# Patient Record
Sex: Female | Born: 1945 | Race: White | Hispanic: No | Marital: Married | Smoking: Never smoker
Health system: Southern US, Community
[De-identification: ages and names within clinical notes are randomized; demographics above are authoritative.]

## PROBLEM LIST (undated history)

## (undated) DIAGNOSIS — M1990 Unspecified osteoarthritis, unspecified site: Secondary | ICD-10-CM

## (undated) DIAGNOSIS — F32A Depression, unspecified: Secondary | ICD-10-CM

## (undated) DIAGNOSIS — Z7901 Long term (current) use of anticoagulants: Secondary | ICD-10-CM

## (undated) DIAGNOSIS — I4891 Unspecified atrial fibrillation: Secondary | ICD-10-CM

## (undated) DIAGNOSIS — L301 Dyshidrosis [pompholyx]: Secondary | ICD-10-CM

## (undated) DIAGNOSIS — G479 Sleep disorder, unspecified: Secondary | ICD-10-CM

## (undated) DIAGNOSIS — H269 Unspecified cataract: Secondary | ICD-10-CM

## (undated) DIAGNOSIS — F329 Major depressive disorder, single episode, unspecified: Secondary | ICD-10-CM

## (undated) DIAGNOSIS — I499 Cardiac arrhythmia, unspecified: Secondary | ICD-10-CM

## (undated) DIAGNOSIS — E042 Nontoxic multinodular goiter: Secondary | ICD-10-CM

## (undated) DIAGNOSIS — I471 Supraventricular tachycardia, unspecified: Secondary | ICD-10-CM

## (undated) DIAGNOSIS — E039 Hypothyroidism, unspecified: Secondary | ICD-10-CM

## (undated) DIAGNOSIS — E785 Hyperlipidemia, unspecified: Secondary | ICD-10-CM

## (undated) DIAGNOSIS — J449 Chronic obstructive pulmonary disease, unspecified: Secondary | ICD-10-CM

## (undated) HISTORY — PX: THYROIDECTOMY, PARTIAL: SHX18

---

## 2004-07-29 ENCOUNTER — Ambulatory Visit: Payer: Self-pay | Admitting: Gastroenterology

## 2008-07-20 ENCOUNTER — Ambulatory Visit: Payer: Self-pay | Admitting: Internal Medicine

## 2009-07-23 ENCOUNTER — Ambulatory Visit: Payer: Self-pay | Admitting: Internal Medicine

## 2010-03-01 ENCOUNTER — Ambulatory Visit: Payer: Self-pay | Admitting: Internal Medicine

## 2010-12-25 ENCOUNTER — Ambulatory Visit: Payer: Self-pay | Admitting: Internal Medicine

## 2012-06-21 ENCOUNTER — Ambulatory Visit: Payer: Self-pay | Admitting: Family Medicine

## 2012-10-13 HISTORY — PX: EYE SURGERY: SHX253

## 2013-09-01 DIAGNOSIS — S83249A Other tear of medial meniscus, current injury, unspecified knee, initial encounter: Secondary | ICD-10-CM | POA: Insufficient documentation

## 2014-10-21 DIAGNOSIS — J209 Acute bronchitis, unspecified: Secondary | ICD-10-CM | POA: Insufficient documentation

## 2014-11-29 DIAGNOSIS — E039 Hypothyroidism, unspecified: Secondary | ICD-10-CM | POA: Insufficient documentation

## 2015-10-14 DIAGNOSIS — J189 Pneumonia, unspecified organism: Secondary | ICD-10-CM

## 2015-10-14 HISTORY — DX: Pneumonia, unspecified organism: J18.9

## 2016-12-13 ENCOUNTER — Observation Stay: Payer: Medicare Other

## 2016-12-13 ENCOUNTER — Inpatient Hospital Stay
Admission: EM | Admit: 2016-12-13 | Discharge: 2016-12-15 | DRG: 190 | Disposition: A | Discharge status: Home / Self Care | Payer: Medicare Other | Attending: Internal Medicine | Admitting: Internal Medicine

## 2016-12-13 ENCOUNTER — Encounter: Payer: Self-pay | Admitting: Emergency Medicine

## 2016-12-13 ENCOUNTER — Emergency Department: Payer: Medicare Other

## 2016-12-13 DIAGNOSIS — Z79899 Other long term (current) drug therapy: Secondary | ICD-10-CM

## 2016-12-13 DIAGNOSIS — E890 Postprocedural hypothyroidism: Secondary | ICD-10-CM | POA: Diagnosis present

## 2016-12-13 DIAGNOSIS — J181 Lobar pneumonia, unspecified organism: Secondary | ICD-10-CM

## 2016-12-13 DIAGNOSIS — I517 Cardiomegaly: Secondary | ICD-10-CM | POA: Diagnosis present

## 2016-12-13 DIAGNOSIS — R05 Cough: Secondary | ICD-10-CM

## 2016-12-13 DIAGNOSIS — Z7982 Long term (current) use of aspirin: Secondary | ICD-10-CM

## 2016-12-13 DIAGNOSIS — J441 Chronic obstructive pulmonary disease with (acute) exacerbation: Secondary | ICD-10-CM

## 2016-12-13 DIAGNOSIS — R0902 Hypoxemia: Secondary | ICD-10-CM | POA: Diagnosis not present

## 2016-12-13 DIAGNOSIS — R778 Other specified abnormalities of plasma proteins: Secondary | ICD-10-CM

## 2016-12-13 DIAGNOSIS — I471 Supraventricular tachycardia: Secondary | ICD-10-CM | POA: Diagnosis present

## 2016-12-13 DIAGNOSIS — J189 Pneumonia, unspecified organism: Secondary | ICD-10-CM | POA: Diagnosis present

## 2016-12-13 DIAGNOSIS — I248 Other forms of acute ischemic heart disease: Secondary | ICD-10-CM | POA: Diagnosis present

## 2016-12-13 DIAGNOSIS — R0689 Other abnormalities of breathing: Secondary | ICD-10-CM

## 2016-12-13 DIAGNOSIS — R7989 Other specified abnormal findings of blood chemistry: Secondary | ICD-10-CM

## 2016-12-13 DIAGNOSIS — R053 Chronic cough: Secondary | ICD-10-CM

## 2016-12-13 DIAGNOSIS — J440 Chronic obstructive pulmonary disease with acute lower respiratory infection: Principal | ICD-10-CM | POA: Diagnosis present

## 2016-12-13 DIAGNOSIS — Z880 Allergy status to penicillin: Secondary | ICD-10-CM

## 2016-12-13 DIAGNOSIS — R079 Chest pain, unspecified: Secondary | ICD-10-CM

## 2016-12-13 DIAGNOSIS — Z882 Allergy status to sulfonamides status: Secondary | ICD-10-CM

## 2016-12-13 DIAGNOSIS — J209 Acute bronchitis, unspecified: Secondary | ICD-10-CM | POA: Diagnosis present

## 2016-12-13 HISTORY — DX: Hypothyroidism, unspecified: E03.9

## 2016-12-13 HISTORY — DX: Major depressive disorder, single episode, unspecified: F32.9

## 2016-12-13 HISTORY — DX: Depression, unspecified: F32.A

## 2016-12-13 LAB — CBC WITH DIFFERENTIAL/PLATELET
Basophils Absolute: 0 10*3/uL (ref 0–0.1)
Basophils Relative: 1 %
EOS ABS: 0.2 10*3/uL (ref 0–0.7)
Eosinophils Relative: 3 %
HEMATOCRIT: 39.6 % (ref 35.0–47.0)
Hemoglobin: 13 g/dL (ref 12.0–16.0)
LYMPHS ABS: 1.6 10*3/uL (ref 1.0–3.6)
Lymphocytes Relative: 27 %
MCH: 30.5 pg (ref 26.0–34.0)
MCHC: 32.9 g/dL (ref 32.0–36.0)
MCV: 92.6 fL (ref 80.0–100.0)
MONOS PCT: 12 %
Monocytes Absolute: 0.7 10*3/uL (ref 0.2–0.9)
NEUTROS ABS: 3.4 10*3/uL (ref 1.4–6.5)
NEUTROS PCT: 57 %
Platelets: 169 10*3/uL (ref 150–440)
RBC: 4.28 MIL/uL (ref 3.80–5.20)
RDW: 13.5 % (ref 11.5–14.5)
WBC: 6 10*3/uL (ref 3.6–11.0)

## 2016-12-13 LAB — BASIC METABOLIC PANEL
ANION GAP: 6 (ref 5–15)
BUN: 17 mg/dL (ref 6–20)
CALCIUM: 8.6 mg/dL — AB (ref 8.9–10.3)
CO2: 28 mmol/L (ref 22–32)
Chloride: 108 mmol/L (ref 101–111)
Creatinine, Ser: 0.86 mg/dL (ref 0.44–1.00)
GFR calc Af Amer: >60 mL/min (ref >60)
GFR calc non Af Amer: >60 mL/min (ref >60)
GLUCOSE: 83 mg/dL (ref 65–99)
Potassium: 3.7 mmol/L (ref 3.5–5.1)
Sodium: 142 mmol/L (ref 135–145)

## 2016-12-13 LAB — TROPONIN I: Troponin I: 0.03 ng/mL (ref <0.03)

## 2016-12-13 MED ORDER — SODIUM CHLORIDE 0.9 % IV SOLN: 250.0000 mL | INTRAVENOUS | Status: DC | PRN

## 2016-12-13 MED ORDER — LEVOFLOXACIN 500 MG PO TABS: 500.0000 mg | ORAL_TABLET | Freq: Every day | ORAL | Status: DC

## 2016-12-13 MED ORDER — DEXTROSE 5 % IV SOLN
INTRAVENOUS | Status: AC
  Administered 2016-12-13: 500 mg via INTRAVENOUS
  Filled 2016-12-13: qty 500

## 2016-12-13 MED ORDER — IPRATROPIUM-ALBUTEROL 0.5-2.5 (3) MG/3ML IN SOLN
RESPIRATORY_TRACT | Status: AC
  Administered 2016-12-13: 3 mL via RESPIRATORY_TRACT
  Filled 2016-12-13: qty 3

## 2016-12-13 MED ORDER — SODIUM CHLORIDE 0.9% FLUSH
3.0000 mL | Freq: Two times a day (BID) | INTRAVENOUS | Status: DC
  Administered 2016-12-13 – 2016-12-15 (×4): 3 mL via INTRAVENOUS

## 2016-12-13 MED ORDER — IPRATROPIUM-ALBUTEROL 0.5-2.5 (3) MG/3ML IN SOLN
3.0000 mL | Freq: Once | RESPIRATORY_TRACT | Status: AC
  Administered 2016-12-13: 3 mL via RESPIRATORY_TRACT

## 2016-12-13 MED ORDER — ADULT MULTIVITAMIN W/MINERALS CH
1.0000 | ORAL_TABLET | Freq: Every day | ORAL | Status: DC
  Administered 2016-12-14 – 2016-12-15 (×2): 1 via ORAL
  Filled 2016-12-13 (×4): qty 1

## 2016-12-13 MED ORDER — MOMETASONE FURO-FORMOTEROL FUM 200-5 MCG/ACT IN AERO
2.0000 | INHALATION_SPRAY | Freq: Two times a day (BID) | RESPIRATORY_TRACT | Status: DC
  Administered 2016-12-13 – 2016-12-15 (×4): 2 via RESPIRATORY_TRACT
  Filled 2016-12-13: qty 8.8

## 2016-12-13 MED ORDER — METHYLPREDNISOLONE SODIUM SUCC 125 MG IJ SOLR
125.0000 mg | Freq: Once | INTRAMUSCULAR | Status: AC
  Administered 2016-12-13: 125 mg via INTRAVENOUS

## 2016-12-13 MED ORDER — METHYLPREDNISOLONE SODIUM SUCC 125 MG IJ SOLR
INTRAMUSCULAR | Status: AC
  Filled 2016-12-13: qty 2

## 2016-12-13 MED ORDER — METHYLPREDNISOLONE SODIUM SUCC 125 MG IJ SOLR
60.0000 mg | Freq: Two times a day (BID) | INTRAMUSCULAR | Status: DC
  Administered 2016-12-14 – 2016-12-15 (×3): 60 mg via INTRAVENOUS
  Filled 2016-12-13 (×3): qty 2

## 2016-12-13 MED ORDER — METHYLPREDNISOLONE SODIUM SUCC 125 MG IJ SOLR: 60.0000 mg | Freq: Two times a day (BID) | INTRAMUSCULAR | Status: DC

## 2016-12-13 MED ORDER — ONDANSETRON HCL 4 MG PO TABS: 4.0000 mg | ORAL_TABLET | Freq: Four times a day (QID) | ORAL | Status: DC | PRN

## 2016-12-13 MED ORDER — CITALOPRAM HYDROBROMIDE 10 MG PO TABS: 15.0000 mg | ORAL_TABLET | Freq: Every day | ORAL | Status: DC

## 2016-12-13 MED ORDER — BISACODYL 10 MG RE SUPP: 10.0000 mg | Freq: Every day | RECTAL | Status: DC | PRN

## 2016-12-13 MED ORDER — HEPARIN SODIUM (PORCINE) 5000 UNIT/ML IJ SOLN
5000.0000 [IU] | Freq: Three times a day (TID) | INTRAMUSCULAR | Status: DC
  Administered 2016-12-13 – 2016-12-15 (×5): 5000 [IU] via SUBCUTANEOUS
  Filled 2016-12-13 (×5): qty 1

## 2016-12-13 MED ORDER — ONDANSETRON HCL 4 MG/2ML IJ SOLN: 4.0000 mg | Freq: Four times a day (QID) | INTRAMUSCULAR | Status: DC | PRN

## 2016-12-13 MED ORDER — PANTOPRAZOLE SODIUM 40 MG PO TBEC
40.0000 mg | DELAYED_RELEASE_TABLET | Freq: Every day | ORAL | Status: DC
  Administered 2016-12-13 – 2016-12-15 (×3): 40 mg via ORAL
  Filled 2016-12-13 (×3): qty 1

## 2016-12-13 MED ORDER — MAGNESIUM SULFATE 2 GM/50ML IV SOLN
INTRAVENOUS | Status: AC
  Administered 2016-12-13: 2 g via INTRAVENOUS
  Filled 2016-12-13: qty 50

## 2016-12-13 MED ORDER — ACETAMINOPHEN 325 MG PO TABS
650.0000 mg | ORAL_TABLET | Freq: Four times a day (QID) | ORAL | Status: DC | PRN
  Administered 2016-12-13 – 2016-12-14 (×2): 650 mg via ORAL
  Filled 2016-12-13 (×3): qty 2

## 2016-12-13 MED ORDER — ALBUTEROL SULFATE HFA 108 (90 BASE) MCG/ACT IN AERS: 2.0000 | INHALATION_SPRAY | Freq: Four times a day (QID) | RESPIRATORY_TRACT | 0 refills | Status: DC | PRN

## 2016-12-13 MED ORDER — DOCUSATE SODIUM 100 MG PO CAPS
100.0000 mg | ORAL_CAPSULE | Freq: Two times a day (BID) | ORAL | Status: DC
  Administered 2016-12-13 – 2016-12-15 (×4): 100 mg via ORAL
  Filled 2016-12-13 (×4): qty 1

## 2016-12-13 MED ORDER — CITALOPRAM HYDROBROMIDE 10 MG PO TABS
15.0000 mg | ORAL_TABLET | Freq: Every day | ORAL | Status: DC
  Administered 2016-12-13 – 2016-12-14 (×2): 15 mg via ORAL
  Filled 2016-12-13 (×2): qty 2

## 2016-12-13 MED ORDER — SODIUM CHLORIDE 0.9% FLUSH
3.0000 mL | Freq: Two times a day (BID) | INTRAVENOUS | Status: DC
  Administered 2016-12-13 – 2016-12-14 (×2): 3 mL via INTRAVENOUS

## 2016-12-13 MED ORDER — ACETAMINOPHEN 650 MG RE SUPP: 650.0000 mg | Freq: Four times a day (QID) | RECTAL | Status: DC | PRN

## 2016-12-13 MED ORDER — MAGNESIUM SULFATE 2 GM/50ML IV SOLN
2.0000 g | Freq: Once | INTRAVENOUS | Status: AC
  Administered 2016-12-13: 2 g via INTRAVENOUS

## 2016-12-13 MED ORDER — PREDNISONE 10 MG PO TABS: ORAL_TABLET | ORAL | 0 refills | Status: DC

## 2016-12-13 MED ORDER — GUAIFENESIN ER 600 MG PO TB12
600.0000 mg | ORAL_TABLET | Freq: Two times a day (BID) | ORAL | Status: DC
  Administered 2016-12-13 – 2016-12-15 (×4): 600 mg via ORAL
  Filled 2016-12-13 (×4): qty 1

## 2016-12-13 MED ORDER — IOPAMIDOL (ISOVUE-370) INJECTION 76%
75.0000 mL | Freq: Once | INTRAVENOUS | Status: AC | PRN
  Administered 2016-12-13: 75 mL via INTRAVENOUS
  Filled 2016-12-13: qty 75

## 2016-12-13 MED ORDER — PREDNISONE 20 MG PO TABS
40.0000 mg | ORAL_TABLET | Freq: Once | ORAL | Status: AC
  Administered 2016-12-13: 40 mg via ORAL
  Filled 2016-12-13: qty 2

## 2016-12-13 MED ORDER — IBUPROFEN 600 MG PO TABS: 600.0000 mg | ORAL_TABLET | Freq: Four times a day (QID) | ORAL | Status: DC | PRN

## 2016-12-13 MED ORDER — IPRATROPIUM-ALBUTEROL 0.5-2.5 (3) MG/3ML IN SOLN
3.0000 mL | Freq: Four times a day (QID) | RESPIRATORY_TRACT | Status: DC
  Administered 2016-12-13 – 2016-12-14 (×3): 3 mL via RESPIRATORY_TRACT
  Filled 2016-12-13 (×2): qty 3

## 2016-12-13 MED ORDER — SODIUM CHLORIDE 0.9% FLUSH: 3.0000 mL | INTRAVENOUS | Status: DC | PRN

## 2016-12-13 MED ORDER — AZITHROMYCIN 500 MG IV SOLR
500.0000 mg | INTRAVENOUS | Status: DC
  Administered 2016-12-13 – 2016-12-14 (×2): 500 mg via INTRAVENOUS
  Filled 2016-12-13 (×2): qty 500

## 2016-12-13 MED ORDER — ASPIRIN EC 81 MG PO TBEC
81.0000 mg | DELAYED_RELEASE_TABLET | Freq: Every day | ORAL | Status: DC
  Administered 2016-12-14 – 2016-12-15 (×2): 81 mg via ORAL
  Filled 2016-12-13 (×2): qty 1

## 2016-12-13 NOTE — ED Notes (Signed)
Patient transported to X-ray 

## 2016-12-13 NOTE — H&P (Signed)
History and Physical    Kayla Zamora [id number] DOB: December 12, 1945 DOA: 12/13/2016  Referring physician: Dr. Reita Cliche PCP: No primary care provider on file.  Specialists: none  Chief Complaint: SOB with cough  HPI: Kayla Zamora is a 71 y.o. female has a past medical history significant for thyroid nodules and depression now with 1 week hx of progressive SOB and cough with congestion. Had some chest pain earlier in the week, In ER, pt was hypoxic to 88% with exertion on RA. CXR negative. No hx of COPD/tobacco use or asthma. Currently denies CP. She is now admitted. Cough is dry but pt feels that she has chest congestion.  Review of Systems: The patient denies anorexia, fever, weight loss,, vision loss, decreased hearing, hoarseness, syncope, peripheral edema, balance deficits, hemoptysis, abdominal pain, melena, hematochezia, severe indigestion/heartburn, hematuria, incontinence, genital sores, muscle weakness, suspicious skin lesions, transient blindness, difficulty walking, depression, unusual weight change, abnormal bleeding, enlarged lymph nodes, angioedema, and breast masses.   Past Medical History:  Diagnosis Date  . Depression   . Hypothyroidism    Past Surgical History:  Procedure Laterality Date  . CESAREAN SECTION    . THYROIDECTOMY, PARTIAL     Social History:  reports that she has never smoked. She has never used smokeless tobacco. Her alcohol and drug histories are not on file.  Allergies  Allergen Reactions  . Levofloxacin Other (See Comments)    Jittery  . Penicillins Swelling and Rash    Has patient had a PCN reaction causing immediate rash, facial/tongue/throat swelling, SOB or lightheadedness with hypotension: No Has patient had a PCN reaction causing severe rash involving mucus membranes or skin necrosis: No Has patient had a PCN reaction that required hospitalization No Has patient had a PCN reaction occurring within the last 10 years: No If all of the above  answers are "NO", then may proceed with Cephalosporin use.   . Sulfa Antibiotics Rash    History reviewed. No pertinent family history.  Prior to Admission medications   Medication Sig Start Date End Date Taking? Authorizing Provider  acetaminophen (TYLENOL) 650 MG CR tablet Take 650 mg by mouth every 8 (eight) hours as needed.   Yes Historical Provider, MD  aspirin EC 81 MG tablet Take 81 mg by mouth daily.   Yes Historical Provider, MD  citalopram (CELEXA) 10 MG tablet Take 15 mg by mouth daily. 10/21/16  Yes Historical Provider, MD  ibuprofen (ADVIL,MOTRIN) 200 MG tablet Take 200 mg by mouth every 6 (six) hours as needed.   Yes Historical Provider, MD  Multiple Vitamin (MULTI-VITAMINS) TABS Take 1 tablet by mouth daily.   Yes Historical Provider, MD  albuterol (PROVENTIL HFA;VENTOLIN HFA) 108 (90 Base) MCG/ACT inhaler Inhale 2 puffs into the lungs every 6 (six) hours as needed for wheezing or shortness of breath. 12/13/16   Lisa Roca, MD  predniSONE (DELTASONE) 10 MG tablet 40mg  daily for 4 more days 12/13/16   Lisa Roca, MD   Physical Exam: Vitals:   12/13/16 1253 12/13/16 1432  BP: 134/68   Pulse: 100   Resp: 18   Temp: 98.1 F (36.7 C)   TempSrc: Oral   SpO2: 95% (!) 88%  Weight: 75.8 kg (167 lb)   Height: 5\' 4"  (1.626 m)      General:  No apparent distress, [place]/AT, WDWN  Eyes: PERRL, EOMI, no scleral icterus, conjunctiva clear  ENT: moist oropharynx without exudate, TM's benign, dentition good  Neck: supple, no lymphadenopathy. No bruits or thyromegaly  Cardiovascular: regular rate without MRG; 2+ peripheral pulses, no JVD, no peripheral edema  Respiratory: diffuse rhonchi and wheezes. No rales or dullness. Respiratory effort increased  Abdomen: soft, non tender to palpation, positive bowel sounds, no guarding, no rebound  Skin: no rashes or lesions  Musculoskeletal: normal bulk and tone, no joint swelling  Psychiatric: normal mood and affect,  A&OX3  Neurologic: CN 2-12 grossly intact, Motor strength 5/5 in all 4 groups with symmetric DTR;s and non-focal sensory exam  Labs on Admission:  Basic Metabolic Panel: No results for input(s): NA, K, CL, CO2, GLUCOSE, BUN, CREATININE, CALCIUM, MG, PHOS in the last 168 hours. Liver Function Tests: No results for input(s): AST, ALT, ALKPHOS, BILITOT, PROT, ALBUMIN in the last 168 hours. No results for input(s): LIPASE, AMYLASE in the last 168 hours. No results for input(s): AMMONIA in the last 168 hours. CBC: No results for input(s): WBC, NEUTROABS, HGB, HCT, MCV, PLT in the last 168 hours. Cardiac Enzymes: No results for input(s): CKTOTAL, CKMB, CKMBINDEX, TROPONINI in the last 168 hours.  BNP (last 3 results) No results for input(s): BNP in the last 8760 hours.  ProBNP (last 3 results) No results for input(s): PROBNP in the last 8760 hours.  CBG: No results for input(s): GLUCAP in the last 168 hours.  Radiological Exams on Admission: Dg Chest 2 View  Result Date: 12/13/2016 CLINICAL DATA:  Wheezing and coughing for the last week. EXAM: CHEST  2 VIEW COMPARISON:  None. FINDINGS: Both lungs are clear. Heart and mediastinum are within normal limits. Atherosclerotic calcifications at the aortic arch. No pleural effusions. Mild endplate changes in the thoracic spine. IMPRESSION: No active cardiopulmonary disease. Aortic atherosclerosis. Electronically Signed   By: Markus Daft M.D.   On: 12/13/2016 14:04    EKG: Independently reviewed.  Assessment/Plan Principal Problem:   Acute respiratory insufficiency Active Problems:   Acute bronchitis   Chest pain   Persistent cough   Will observe on telemetry with IV steroids, IV ABX, SVN's and O2 as needed. CT chest to r/o PE. Follow cardiac enzymes. Retest O2 sats in AM.  Diet: regular Fluids: saline lock DVT Prophylaxis: SQ Heparin  Code Status: FULL  Family Communication: yes  Disposition Plan: home  Time spent: 50 min

## 2016-12-13 NOTE — ED Notes (Signed)
Pt walked around nurses station, 88% RA on arrival back to room, after a few mins of sitting on side of bed O2 up to 93%.

## 2016-12-13 NOTE — ED Provider Notes (Addendum)
[facility] Emergency Department Provider Note ____________________________________________   I have reviewed the triage vital signs and the triage nursing note.  HISTORY  Chief Complaint Cough   Historian Patient  HPI Kayla Zamora is a 71 y.o. female without any history of lung disease, states that she has been coughing and wheezing for about one week now. No fever.No abdominal pain, vomiting or diarrhea.  Some mild chest discomfort a few days ago, none since then. No chest pain now. No palpitations. No dizziness or passing out.  She did try to an urgent care, and was sent to the ED for further evaluation.      History reviewed. No pertinent past medical history.  There are no active problems to display for this patient.   Past Surgical History:  Procedure Laterality Date  . CESAREAN SECTION    . THYROIDECTOMY, PARTIAL      Prior to Admission medications   Medication Sig Start Date End Date Taking? Authorizing Provider  albuterol (PROVENTIL HFA;VENTOLIN HFA) 108 (90 Base) MCG/ACT inhaler Inhale 2 puffs into the lungs every 6 (six) hours as needed for wheezing or shortness of breath. 12/13/16   Lisa Roca, MD  predniSONE (DELTASONE) 10 MG tablet 40mg  daily for 4 more days 12/13/16   Lisa Roca, MD    Allergies  Allergen Reactions  . Penicillins Rash  . Sulfa Antibiotics Rash    No family history on file.  Social History Social History  Substance Use Topics  . Smoking status: Never Smoker  . Smokeless tobacco: Not on file  . Alcohol use Not on file    Review of Systems  Constitutional: Negative for fever. Eyes: Negative for visual changes. ENT: Negative for sore throat. Cardiovascular: Negative for chest pain. Respiratory: Positive for shortness of breath. Gastrointestinal: Negative for abdominal pain, vomiting and diarrhea. Genitourinary: Negative for dysuria. Musculoskeletal: Negative for back pain. Skin: Negative for  rash. Neurological: She has been having mild headaches, but she had ibuprofen this morning and headache is gone now. 10 point Review of Systems otherwise negative ____________________________________________   PHYSICAL EXAM:  VITAL SIGNS: ED Triage Vitals  Enc Vitals Group     BP 12/13/16 1253 134/68     Pulse Rate 12/13/16 1253 100     Resp 12/13/16 1253 18     Temp 12/13/16 1253 98.1 F (36.7 C)     Temp Source 12/13/16 1253 Oral     SpO2 12/13/16 1253 95 %     Weight 12/13/16 1253 167 lb (75.8 kg)     Height 12/13/16 1253 5\' 4"  (1.626 m)     Head Circumference --      Peak Flow --      Pain Score 12/13/16 1254 0     Pain Loc --      Pain Edu? --      Excl. in [place]? --      Constitutional: Alert and oriented. Well appearing and in no distress. HEENT   Head: Normocephalic and atraumatic.      Eyes: Conjunctivae are normal. PERRL. Normal extraocular movements.      Ears:         Nose: No congestion/rhinnorhea.   Mouth/Throat: Mucous membranes are moist.   Neck: No stridor. Cardiovascular/Chest: Normal rate, regular rhythm.  No murmurs, rubs, or gallops. Respiratory: No tachypnea retractions. She does have moderate and x-ray report wheezing all fields. Gastrointestinal: Soft. No distention, no guarding, no rebound. Nontender.    Genitourinary/rectal:Deferred Musculoskeletal: Nontender with normal  range of motion in all extremities. No joint effusions.  No lower extremity tenderness.  No edema. Neurologic:  Normal speech and language. No gross or focal neurologic deficits are appreciated. Skin:  Skin is warm, dry and intact. No rash noted. Psychiatric: Mood and affect are normal. Speech and behavior are normal. Patient exhibits appropriate insight and judgment.   ____________________________________________  LABS (pertinent positives/negatives)  Labs Reviewed  BASIC METABOLIC PANEL  TROPONIN I  CBC WITH DIFFERENTIAL/PLATELET     ____________________________________________    EKG I, Lisa Roca, MD, the attending physician have personally viewed and interpreted all ECGs.  97 beats per minute.   normal sinus rhythm. normal axis. Normal ST and T-wave. ____________________________________________  RADIOLOGY All Xrays were viewed by me. Imaging interpreted by Radiologist.  CXR:  IMPRESSION: No active cardiopulmonary disease.  Aortic atherosclerosis. __________________________________________  PROCEDURES  Procedure(s) performed: None  Critical Care performed: None  ____________________________________________   ED COURSE / ASSESSMENT AND PLAN  Pertinent labs & imaging results that were available during my care of the patient were reviewed by me and considered in my medical decision making (see chart for details).    Ms. Poirrier has wheezing, no smoking history or prior lung issues, and was treated with DuoNeb with some improvement, but still underlying wheezing. We discussed adding prednisone as well.  No other systemic symptoms. She is overall well-appearing. No hypoxia. Chest x-ray is clear for no focal infiltrate or pneumonia.  I did add an EKG given some central chest tightness several days ago, the EKG is reassuring. She is having no chest pain or chest discomfort right now. I'll think any laboratory studies are indicated right now.  I would have her follow up with primary care doctor, I will also give her the office number for cardiologist given her age for outpatient cardiology.   CONSULTATIONS: Dr. Doy Hutching, hospitalist for admission.  Patient / Family / Caregiver informed of clinical course, medical decision-making process, and agree with plan.   I discussed return precautions, follow-up instructions, and discharge instructions with patient and/or family.      Addended to include patient did walk and dropped O2 sat to 88% on room air, and was dyspneic and took her several  minutes to recover back to 93% at rest.  At this point with hypoxia due to the bronchospasm, I am going to give her 2 additional DuoNeb's, place an IV and give her IV Solu-Medrol and admit her to the hospital for treatment and likely need for home 02.  ___________________________________________   FINAL CLINICAL IMPRESSION(S) / ED DIAGNOSES   Final diagnoses:  Bronchospasm with bronchitis, acute  Hypoxia              Note: This dictation was prepared with Dragon dictation. Any transcriptional errors that result from this process are unintentional    Lisa Roca, MD 12/13/16 [place], MD 12/13/16 [place], MD 12/13/16 [phone]

## 2016-12-13 NOTE — ED Notes (Signed)
Pt arriving back from xray.

## 2016-12-13 NOTE — ED Notes (Signed)
Patient transported to CT. Will transport to floor following CT.

## 2016-12-13 NOTE — Discharge Instructions (Signed)
You were evaluated for shortness of breath and found to have wheezing also called bronco spasm which I suspect was brought on by some sort of virus/bronchitis.  Return to the emergency department immediately for any new or worsening shortness breath, fever, trouble breathing, chest pain, dizziness or passing out, or any other symptoms concerning to you.

## 2016-12-13 NOTE — ED Notes (Signed)
Pt stating that she has been wheezy and coughing for about the last week. Pt stating that she went to a "minute clinic." Pt stating that she was told that she couldn't be treated there because of her pulse O2. Pt stating that she has also had a HA but denies fever. Pt stating she knows that she has post-nasal drip. Pt denying pain.

## 2016-12-13 NOTE — ED Triage Notes (Signed)
Cough x 1 week, speaking full sentences.

## 2016-12-14 DIAGNOSIS — J440 Chronic obstructive pulmonary disease with acute lower respiratory infection: Secondary | ICD-10-CM | POA: Diagnosis present

## 2016-12-14 DIAGNOSIS — J189 Pneumonia, unspecified organism: Secondary | ICD-10-CM | POA: Diagnosis present

## 2016-12-14 DIAGNOSIS — Z79899 Other long term (current) drug therapy: Secondary | ICD-10-CM | POA: Diagnosis not present

## 2016-12-14 DIAGNOSIS — J209 Acute bronchitis, unspecified: Secondary | ICD-10-CM | POA: Diagnosis present

## 2016-12-14 DIAGNOSIS — I517 Cardiomegaly: Secondary | ICD-10-CM | POA: Diagnosis present

## 2016-12-14 DIAGNOSIS — R0689 Other abnormalities of breathing: Secondary | ICD-10-CM | POA: Diagnosis present

## 2016-12-14 DIAGNOSIS — I471 Supraventricular tachycardia: Secondary | ICD-10-CM | POA: Diagnosis present

## 2016-12-14 DIAGNOSIS — Z880 Allergy status to penicillin: Secondary | ICD-10-CM | POA: Diagnosis not present

## 2016-12-14 DIAGNOSIS — R0902 Hypoxemia: Secondary | ICD-10-CM | POA: Diagnosis present

## 2016-12-14 DIAGNOSIS — Z882 Allergy status to sulfonamides status: Secondary | ICD-10-CM | POA: Diagnosis not present

## 2016-12-14 DIAGNOSIS — I248 Other forms of acute ischemic heart disease: Secondary | ICD-10-CM | POA: Diagnosis present

## 2016-12-14 DIAGNOSIS — J441 Chronic obstructive pulmonary disease with (acute) exacerbation: Secondary | ICD-10-CM | POA: Diagnosis present

## 2016-12-14 DIAGNOSIS — Z7982 Long term (current) use of aspirin: Secondary | ICD-10-CM | POA: Diagnosis not present

## 2016-12-14 DIAGNOSIS — E890 Postprocedural hypothyroidism: Secondary | ICD-10-CM | POA: Diagnosis present

## 2016-12-14 LAB — CBC
HCT: 38.4 % (ref 35.0–47.0)
HEMOGLOBIN: 12.6 g/dL (ref 12.0–16.0)
MCH: 30.6 pg (ref 26.0–34.0)
MCHC: 32.9 g/dL (ref 32.0–36.0)
MCV: 92.9 fL (ref 80.0–100.0)
PLATELETS: 159 10*3/uL (ref 150–440)
RBC: 4.14 MIL/uL (ref 3.80–5.20)
RDW: 13.5 % (ref 11.5–14.5)
WBC: 4.6 10*3/uL (ref 3.6–11.0)

## 2016-12-14 LAB — COMPREHENSIVE METABOLIC PANEL
ALK PHOS: 89 U/L (ref 38–126)
ALT: 37 U/L (ref 14–54)
AST: 39 U/L (ref 15–41)
Albumin: 3.7 g/dL (ref 3.5–5.0)
Anion gap: 10 (ref 5–15)
BUN: 17 mg/dL (ref 6–20)
CALCIUM: 9 mg/dL (ref 8.9–10.3)
CO2: 25 mmol/L (ref 22–32)
Chloride: 105 mmol/L (ref 101–111)
Creatinine, Ser: 0.77 mg/dL (ref 0.44–1.00)
GFR calc Af Amer: >60 mL/min (ref >60)
GFR calc non Af Amer: >60 mL/min (ref >60)
Glucose, Bld: 158 mg/dL — ABNORMAL HIGH (ref 65–99)
Potassium: 3.7 mmol/L (ref 3.5–5.1)
SODIUM: 140 mmol/L (ref 135–145)
TOTAL PROTEIN: 7.2 g/dL (ref 6.5–8.1)
Total Bilirubin: 0.4 mg/dL (ref 0.3–1.2)

## 2016-12-14 LAB — TROPONIN I
Troponin I: <0.03 ng/mL (ref <0.03)
Troponin I: <0.03 ng/mL (ref <0.03)

## 2016-12-14 LAB — MAGNESIUM: MAGNESIUM: 2.2 mg/dL (ref 1.7–2.4)

## 2016-12-14 MED ORDER — METOPROLOL TARTRATE 25 MG PO TABS
25.0000 mg | ORAL_TABLET | Freq: Two times a day (BID) | ORAL | Status: DC
  Administered 2016-12-14 – 2016-12-15 (×2): 25 mg via ORAL
  Filled 2016-12-14 (×3): qty 1

## 2016-12-14 MED ORDER — IPRATROPIUM-ALBUTEROL 0.5-2.5 (3) MG/3ML IN SOLN
3.0000 mL | RESPIRATORY_TRACT | Status: DC
  Administered 2016-12-14 – 2016-12-15 (×7): 3 mL via RESPIRATORY_TRACT
  Filled 2016-12-14 (×6): qty 3

## 2016-12-14 MED ORDER — HYDROCOD POLST-CPM POLST ER 10-8 MG/5ML PO SUER
5.0000 mL | Freq: Two times a day (BID) | ORAL | Status: DC
  Administered 2016-12-14 – 2016-12-15 (×3): 5 mL via ORAL
  Filled 2016-12-14 (×3): qty 5

## 2016-12-14 NOTE — Evaluation (Signed)
Physical Therapy Evaluation Patient Details Name: Kayla Zamora MRN: [id number] DOB: October 14, 1945 Today's Date: 12/14/2016   History of Present Illness  Pt admitted for acute respiratory insufficiency. Pt with complaints of SOB and cough. Pt with history of depression.  Clinical Impression  Pt is a pleasant 71 year old female who was admitted for acute respiratory insufficiency.  Pt demonstrates all bed mobility/transfers/ambulation at baseline level with no use for AD. Pt independent at baseline. No SOB symptoms with exertion. Pt does not require any further PT needs at this time. Pt will be dc in house and does not require follow up. RN aware. Will dc current orders.      Follow Up Recommendations No PT follow up    Equipment Recommendations  None recommended by PT    Recommendations for Other Services       Precautions / Restrictions Precautions Precautions: Fall Restrictions Weight Bearing Restrictions: No      Mobility  Bed Mobility Overal bed mobility: Independent             General bed mobility comments: safe technique performed with upright posture. Pt reports no SOB with upright sitting  Transfers Overall transfer level: Independent Equipment used: None             General transfer comment: safe technique performed  Ambulation/Gait Ambulation/Gait assistance: Independent Ambulation Distance (Feet): 200 Feet Assistive device: None Gait Pattern/deviations: WFL(Within Functional Limits)     General Gait Details: ambulated around RN station, able to carry conversation with ambulation without SOB symptoms. O2 sats at 93% on room air with exertion.  Stairs            Wheelchair Mobility    Modified Rankin (Stroke Patients Only)       Balance Overall balance assessment: Independent                                           Pertinent Vitals/Pain Pain Assessment: No/denies pain    Home Living Family/patient expects to  be discharged to:: Private residence Living Arrangements: Alone   Type of Home: House Home Access: Stairs to enter Entrance Stairs-Rails: Can reach both Entrance Stairs-Number of Steps: 4 Home Layout: Able to live on main level with bedroom/bathroom Home Equipment: None      Prior Function Level of Independence: Independent         Comments: just drove to [place] this week before feeling sick     Hand Dominance        Extremity/Trunk Assessment   Upper Extremity Assessment Upper Extremity Assessment: Overall WFL for tasks assessed    Lower Extremity Assessment Lower Extremity Assessment: Overall WFL for tasks assessed (B ankles slightly swollen)       Communication   Communication: No difficulties  Cognition Arousal/Alertness: Awake/alert Behavior During Therapy: WFL for tasks assessed/performed Overall Cognitive Status: Within Functional Limits for tasks assessed                      General Comments      Exercises     Assessment/Plan    PT Assessment Patent does not need any further PT services  PT Problem List         PT Treatment Interventions      PT Goals (Current goals can be found in the Care Plan section)  Acute Rehab PT Goals Patient Stated Goal:  to go home PT Goal Formulation: All assessment and education complete, DC therapy Time For Goal Achievement: 12/14/16 Potential to Achieve Goals: Good    Frequency     Barriers to discharge        Co-evaluation               End of Session   Activity Tolerance: Patient tolerated treatment well Patient left: in bed Nurse Communication: Mobility status PT Visit Diagnosis: Muscle weakness (generalized) (M62.81)    Functional Assessment Tool Used: AM-PAC 6 Clicks Basic Mobility Functional Limitation: Mobility: Walking and moving around Mobility: Walking and Moving Around Current Status [id number]): 0 percent impaired, limited or restricted Mobility: Walking and Moving  Around Goal Status [phone]): 0 percent impaired, limited or restricted Mobility: Walking and Moving Around Discharge Status [phone]): 0 percent impaired, limited or restricted    Time: [id number] PT Time Calculation (min) (ACUTE ONLY): 10 min   Charges:   PT Evaluation $PT Eval Low Complexity: 1 Procedure     PT G Codes:   PT G-Codes **NOT FOR INPATIENT CLASS** Functional Assessment Tool Used: AM-PAC 6 Clicks Basic Mobility Functional Limitation: Mobility: Walking and moving around Mobility: Walking and Moving Around Current Status [id number]): 0 percent impaired, limited or restricted Mobility: Walking and Moving Around Goal Status [id number]): 0 percent impaired, limited or restricted Mobility: Walking and Moving Around Discharge Status [id number]): 0 percent impaired, limited or restricted     Payam Gribble 12/14/2016, 11:28 AM  Greggory Stallion, PT, DPT [phone]

## 2016-12-14 NOTE — Progress Notes (Signed)
Notified Dr Ether Griffins of pt 6 beat run of SVT. Will continue to monitor

## 2016-12-14 NOTE — Progress Notes (Signed)
Kayla Zamora at [place] NAME: Kayla Zamora    MR#:  [id number]  DATE OF BIRTH:  07/27/1946  SUBJECTIVE:  CHIEF COMPLAINT:   Chief Complaint  Patient presents with  . Cough  The patient is 71 year old Caucasian female with past medical history significant for history of thyroid nodules, depression, who presents to the hospital with complaints of shortness of breath, cough, chest congestion. On arrival to the hospital the patient was noted to be hypoxic with O2 sats of 88% on room air on exertion. She admits of dry cough, no significant sputum production.   Review of Systems  Constitutional: Negative for chills, fever and weight loss.  HENT: Negative for congestion.   Eyes: Negative for blurred vision and double vision.  Respiratory: Positive for cough and shortness of breath. Negative for sputum production and wheezing.   Cardiovascular: Negative for chest pain, palpitations, orthopnea, leg swelling and PND.  Gastrointestinal: Negative for abdominal pain, blood in stool, constipation, diarrhea, nausea and vomiting.  Genitourinary: Negative for dysuria, frequency, hematuria and urgency.  Musculoskeletal: Negative for falls.  Neurological: Negative for dizziness, tremors, focal weakness and headaches.  Endo/Heme/Allergies: Does not bruise/bleed easily.  Psychiatric/Behavioral: Negative for depression. The patient does not have insomnia.     VITAL SIGNS: Blood pressure (!) 129/47, pulse (!) 105, temperature 98.4 F (36.9 C), temperature source Oral, resp. rate 18, height 5\' 4"  (1.626 m), weight 77.2 kg (170 lb 3.1 oz), SpO2 91 %.  PHYSICAL EXAMINATION:   GENERAL:  71 y.o.-year-old patient lying in the bed with no acute distress.  EYES: Pupils equal, round, reactive to light and accommodation. No scleral icterus. Extraocular muscles intact.  HEENT: Head atraumatic, normocephalic. Oropharynx and nasopharynx clear.  NECK:  Supple, no  jugular venous distention. No thyroid enlargement, no tenderness.  LUNGS: Normal breath sounds bilaterally, no wheezing, rales,rhonchi ,  but left lower lung area rales and crepitations posteriorly where noted  No use of accessory muscles of respiration.  CARDIOVASCULAR: S1, S2 normal. No murmurs, rubs, or gallops.  ABDOMEN: Soft, nontender, nondistended. Bowel sounds present. No organomegaly or mass.  EXTREMITIES: No pedal edema, cyanosis, or clubbing.  NEUROLOGIC: Cranial nerves II through XII are intact. Muscle strength 5/5 in all extremities. Sensation intact. Gait not checked.  PSYCHIATRIC: The patient is alert and oriented x 3.  SKIN: No obvious rash, lesion, or ulcer.   ORDERS/RESULTS REVIEWED:   CBC  Recent Labs Lab 12/13/16 1451 12/14/16 0304  WBC 6.0 4.6  HGB 13.0 12.6  HCT 39.6 38.4  PLT 169 159  MCV 92.6 92.9  MCH 30.5 30.6  MCHC 32.9 32.9  RDW 13.5 13.5  LYMPHSABS 1.6  --   MONOABS 0.7  --   EOSABS 0.2  --   BASOSABS 0.0  --    ------------------------------------------------------------------------------------------------------------------  Chemistries   Recent Labs Lab 12/13/16 1451 12/14/16 0304 12/14/16 0937  NA 142 140  --   K 3.7 3.7  --   CL 108 105  --   CO2 28 25  --   GLUCOSE 83 158*  --   BUN 17 17  --   CREATININE 0.86 0.77  --   CALCIUM 8.6* 9.0  --   MG  --   --  2.2  AST  --  39  --   ALT  --  37  --   ALKPHOS  --  89  --   BILITOT  --  0.4  --    ------------------------------------------------------------------------------------------------------------------  estimated creatinine clearance is 66.7 mL/min (by C-G formula based on SCr of 0.77 mg/dL). ------------------------------------------------------------------------------------------------------------------ No results for input(s): TSH, T4TOTAL, T3FREE, THYROIDAB in the last 72 hours.  Invalid input(s): FREET3  Cardiac Enzymes  Recent Labs Lab 12/13/16 2108  12/14/16 0304 12/14/16 0937  TROPONINI <0.03 <0.03 <0.03   ------------------------------------------------------------------------------------------------------------------ Invalid input(s): POCBNP ---------------------------------------------------------------------------------------------------------------  RADIOLOGY: Dg Chest 2 View  Result Date: 12/13/2016 CLINICAL DATA:  Wheezing and coughing for the last week. EXAM: CHEST  2 VIEW COMPARISON:  None. FINDINGS: Both lungs are clear. Heart and mediastinum are within normal limits. Atherosclerotic calcifications at the aortic arch. No pleural effusions. Mild endplate changes in the thoracic spine. IMPRESSION: No active cardiopulmonary disease. Aortic atherosclerosis. Electronically Signed   By: Markus Daft M.D.   On: 12/13/2016 14:04   Ct Angio Chest Pe W Or Wo Contrast  Result Date: 12/13/2016 CLINICAL DATA:  One week history of progressive shortness of breath, cough and congestion. EXAM: CT ANGIOGRAPHY CHEST WITH CONTRAST TECHNIQUE: Multidetector CT imaging of the chest was performed using the standard protocol during bolus administration of intravenous contrast. Multiplanar CT image reconstructions and MIPs were obtained to evaluate the vascular anatomy. CONTRAST:  75 cc Isovue 370 COMPARISON:  Chest x-ray same date. FINDINGS: Chest wall: No breast masses, supraclavicular or axillary lymphadenopathy. Small scattered lymph nodes are noted. Right-sided multinodular thyroid goiter. The left thyroid lobe is not visualized. Correlation with thyroid ultrasound may be helpful. Cardiovascular: The heart is within normal limits in size for age. No pericardial effusion. The aorta is normal in caliber. No dissection. The branch vessels are patent. No obvious coronary artery calcifications. The pulmonary arterial tree is fairly well opacified. No filling defects to suggest pulmonary embolism. Mediastinum/Nodes: Borderline mediastinal and hilar lymph nodes  likely related to the lung findings. The esophagus is grossly normal. Lungs/Pleura: Patchy left lower lobe airspace process consistent with pneumonia. Minimal right basilar atelectasis. No worrisome pulmonary lesions or pleural effusion. Upper Abdomen: No significant upper abdominal findings. Suspect small gallstone in the gallbladder. Musculoskeletal: No significant bony findings. Review of the MIP images confirms the above findings. IMPRESSION: 1. No CT findings for pulmonary embolism. 2. Normal thoracic aorta. 3. Left lower lobe pneumonia. Electronically Signed   By: Marijo Sanes M.D.   On: 12/13/2016 17:15    EKG:  Orders placed or performed during the hospital encounter of 12/13/16  . ED EKG  . ED EKG    ASSESSMENT AND PLAN:  Principal Problem:   Acute respiratory insufficiency Active Problems:   Acute bronchitis   Chest pain   Persistent cough  #1. Acute respiratory distress due to left lower lobe pneumonia, continue oxygen therapy as needed, check O2 sats on room air on exertion   #2 left lower lobe pneumonia, continue antibiotic therapy with Zithromax, get sputum culture, if possible, continue duo nebs, supportive therapy #3. Elevated troponin, likely demand ischemia, get echocardiogram, initiate patient on metoprolol, aspirin #4 6 beats SVT, initiated on metoprolol, magnesium level was normal, follow clinically, telemetry  Management plans discussed with the patient, family and they are in agreement.   DRUG ALLERGIES:  Allergies  Allergen Reactions  . Levofloxacin Other (See Comments)    Jittery  . Penicillins Swelling and Rash    Has patient had a PCN reaction causing immediate rash, facial/tongue/throat swelling, SOB or lightheadedness with hypotension: No Has patient had a PCN reaction causing severe rash involving mucus membranes or skin necrosis: No Has patient had a PCN reaction that required hospitalization  No Has patient had a PCN reaction occurring within the  last 10 years: No If all of the above answers are "NO", then may proceed with Cephalosporin use.   . Sulfa Antibiotics Rash    CODE STATUS:     Code Status Orders        Start     Ordered   12/13/16 1639  Full code  Continuous     12/13/16 1638    Code Status History    Date Active Date Inactive Code Status Order ID Comments User Context   This patient has a current code status but no historical code status.    Advance Directive Documentation   Flowsheet Row Most Recent Value  Type of Advance Directive  Healthcare Power of Attorney  Pre-existing out of facility DNR order (yellow form or pink MOST form)  No data  "MOST" Form in Place?  No data      TOTAL TIME TAKING CARE OF THIS PATIENT 40 minutes.    Theodoro Grist M.D on 12/14/2016 at 1:20 PM  Between 7am to 6pm - Pager - [phone]  After 6pm go to [email or web address] - password [email or web address]  [place] [facility]  Office  [phone]  CC: Primary care physician; No primary care provider on file.

## 2016-12-14 NOTE — Care Management Obs Status (Signed)
[place] NOTIFICATION   Patient Details  Name: Kayla Zamora MRN: [id number] Date of Birth: August 26, 1946   Medicare Observation Status Notification Given:  Yes    Brinlyn Cena A, RN 12/14/2016, 3:39 PM

## 2016-12-15 ENCOUNTER — Inpatient Hospital Stay
Admit: 2016-12-15 | Discharge: 2016-12-15 | Disposition: A | Discharge status: Home / Self Care | Payer: Medicare Other | Attending: Internal Medicine | Admitting: Internal Medicine

## 2016-12-15 DIAGNOSIS — R7989 Other specified abnormal findings of blood chemistry: Secondary | ICD-10-CM

## 2016-12-15 DIAGNOSIS — R778 Other specified abnormalities of plasma proteins: Secondary | ICD-10-CM

## 2016-12-15 DIAGNOSIS — J441 Chronic obstructive pulmonary disease with (acute) exacerbation: Secondary | ICD-10-CM

## 2016-12-15 DIAGNOSIS — I471 Supraventricular tachycardia, unspecified: Secondary | ICD-10-CM

## 2016-12-15 LAB — ECHOCARDIOGRAM COMPLETE
HEIGHTINCHES: 64 in
Weight: 2723.12 oz

## 2016-12-15 LAB — TSH: TSH: 0.074 u[IU]/mL — ABNORMAL LOW (ref 0.350–4.500)

## 2016-12-15 LAB — T4, FREE: FREE T4: 0.83 ng/dL (ref 0.61–1.12)

## 2016-12-15 MED ORDER — ALBUTEROL SULFATE HFA 108 (90 BASE) MCG/ACT IN AERS: 2.0000 | INHALATION_SPRAY | RESPIRATORY_TRACT | 2 refills | Status: DC | PRN

## 2016-12-15 MED ORDER — AZITHROMYCIN 250 MG PO TABS: 250.0000 mg | ORAL_TABLET | Freq: Every day | ORAL | 0 refills | Status: DC

## 2016-12-15 MED ORDER — PREDNISONE 10 MG (21) PO TBPK: ORAL_TABLET | ORAL | 0 refills | Status: DC

## 2016-12-15 MED ORDER — GUAIFENESIN ER 600 MG PO TB12: 600.0000 mg | ORAL_TABLET | Freq: Two times a day (BID) | ORAL | 0 refills | Status: DC

## 2016-12-15 MED ORDER — ONDANSETRON HCL 4 MG PO TABS: 4.0000 mg | ORAL_TABLET | Freq: Four times a day (QID) | ORAL | 0 refills | Status: DC | PRN

## 2016-12-15 MED ORDER — HYDROCOD POLST-CPM POLST ER 10-8 MG/5ML PO SUER: 5.0000 mL | Freq: Two times a day (BID) | ORAL | 0 refills | Status: DC

## 2016-12-15 MED ORDER — METOPROLOL TARTRATE 25 MG PO TABS: 25.0000 mg | ORAL_TABLET | Freq: Two times a day (BID) | ORAL | 6 refills | Status: DC

## 2016-12-15 MED ORDER — MOMETASONE FURO-FORMOTEROL FUM 200-5 MCG/ACT IN AERO: 2.0000 | INHALATION_SPRAY | Freq: Two times a day (BID) | RESPIRATORY_TRACT | 6 refills | Status: DC

## 2016-12-15 MED ORDER — IPRATROPIUM-ALBUTEROL 0.5-2.5 (3) MG/3ML IN SOLN: 3.0000 mL | RESPIRATORY_TRACT | 6 refills | Status: DC

## 2016-12-15 NOTE — Progress Notes (Signed)
Pt A and O x 4. VSS. Pt tolerating diet well. No complaints of pain or nausea. IV removed intact, prescriptions given. Pt voiced understanding of discharge instructions with no further questions. Called pharmacy to make sure that all prescriptions had gone through as pharmacy had called with problems with the prescription. Also spoke with Dr. Ether Griffins and okay to discharge patient as Free T4 came back within normal range, spoke with MD and got prescription for albuterol inhaler. Pt discharged via wheelchair with axillary.

## 2016-12-15 NOTE — Discharge Summary (Signed)
[place] at [place] NAME: Kayla Zamora    MR#:  [id number]  DATE OF BIRTH:  [birth year]  DATE OF ADMISSION:  12/13/2016 ADMITTING PHYSICIAN: Idelle Crouch, MD  DATE OF DISCHARGE: 12/15/2016  1:43 PM  PRIMARY CARE PHYSICIAN: No primary care provider on file.     ADMISSION DIAGNOSIS:  Hypoxia [R09.02] Bronchospasm with bronchitis, acute [J20.9]  DISCHARGE DIAGNOSIS:  Principal Problem:   Acute respiratory insufficiency Active Problems:   Left lower lobe pneumonia (HCC)   Pneumonia   COPD exacerbation (HCC)   Chest pain   Persistent cough   Elevated troponin   SVT (supraventricular tachycardia) ([place])   SECONDARY DIAGNOSIS:   Past Medical History:  Diagnosis Date  . Depression   . Hypothyroidism     .pro HOSPITAL COURSE:   The patient is 71 year old Caucasian female with past medical history significant for history of thyroid nodules, depression, who presents to the hospital with complaints of shortness of breath, cough, chest congestion. On arrival to the hospital the patient was noted to be hypoxic with O2 sats of 88% on room air on exertion. She admited of dry cough, no significant sputum production. Chest x-ray showed cardiomegaly, pulmonary disease, CT angiogram of the chest revealed left lower lobe pneumonia. Patient was initiated on antibiotics intravenously admitted to the hospital. The patient was felt to have COPD exacerbation as well, requiring albuterol inhalers, steroids. This conservative therapy. Her condition improved and she was stable to be discharged home. She was recommended to continue steroid taper and antibiotic therapy to complete course, she was advised to continue nebs and cough medications. Oxygenation has improved to 94% on room air at rest and on exertion. Discussion by problem: #1. Acute respiratory distress due to left lower lobe pneumonia, continue oxygen therapy as needed, O2 sats on room air  improved. #2 left lower lobe pneumonia, COPD exacerbation, continue antibiotic therapy with Zithromax, unable to get sputum culture, continue duo nebs, medications, Dulera, clinically improved. Patient is to continue steroid taper for COPD exacerbation, nebulizers #3. Elevated troponin, likely demand ischemia, echocardiogram was unremarkable, continue patient metoprolol, aspirin for now #4 6 beats SVT, continue  metoprolol, magnesium level was normal, TSH was low, but free T4 was normal, no recurrences were noted  DISCHARGE CONDITIONS:   Stable  CONSULTS OBTAINED:    DRUG ALLERGIES:   Allergies  Allergen Reactions  . Levofloxacin Other (See Comments)    Jittery  . Penicillins Swelling and Rash    Has patient had a PCN reaction causing immediate rash, facial/tongue/throat swelling, SOB or lightheadedness with hypotension: No Has patient had a PCN reaction causing severe rash involving mucus membranes or skin necrosis: No Has patient had a PCN reaction that required hospitalization No Has patient had a PCN reaction occurring within the last 10 years: No If all of the above answers are "NO", then may proceed with Cephalosporin use.   . Sulfa Antibiotics Rash    DISCHARGE MEDICATIONS:   Discharge Medication List as of 12/15/2016  1:22 PM    START taking these medications   Details  albuterol (PROVENTIL HFA;VENTOLIN HFA) 108 (90 Base) MCG/ACT inhaler Inhale 2 puffs into the lungs every 4 (four) hours as needed for wheezing or shortness of breath., Starting Mon 12/15/2016, Print    azithromycin (ZITHROMAX) 250 MG tablet Take 1 tablet (250 mg total) by mouth daily., Starting Mon 12/15/2016, Print    chlorpheniramine-HYDROcodone (TUSSIONEX) 10-8 MG/5ML SUER Take 5 mLs by mouth  every 12 (twelve) hours., Starting Mon 12/15/2016, Normal    guaiFENesin (MUCINEX) 600 MG 12 hr tablet Take 1 tablet (600 mg total) by mouth 2 (two) times daily., Starting Mon 12/15/2016, Normal    ipratropium-albuterol  (DUONEB) 0.5-2.5 (3) MG/3ML SOLN Take 3 mLs by nebulization every 4 (four) hours., Starting Mon 12/15/2016, Normal    metoprolol tartrate (LOPRESSOR) 25 MG tablet Take 1 tablet (25 mg total) by mouth 2 (two) times daily., Starting Mon 12/15/2016, Normal    mometasone-formoterol (DULERA) 200-5 MCG/ACT AERO Inhale 2 puffs into the lungs 2 (two) times daily., Starting Mon 12/15/2016, Normal    ondansetron (ZOFRAN) 4 MG tablet Take 1 tablet (4 mg total) by mouth every 6 (six) hours as needed for nausea., Starting Mon 12/15/2016, Normal    predniSONE (STERAPRED UNI-PAK 21 TAB) 10 MG (21) TBPK tablet Please take 6 pills in the morning on the day of 1 and 2, then taper by one pill every 2 days until finished, thank you, Normal      CONTINUE these medications which have NOT CHANGED   Details  acetaminophen (TYLENOL) 650 MG CR tablet Take 650 mg by mouth every 8 (eight) hours as needed., Historical Med    aspirin EC 81 MG tablet Take 81 mg by mouth daily., Historical Med    citalopram (CELEXA) 10 MG tablet Take 15 mg by mouth daily., Starting Tue 10/21/2016, Historical Med    ibuprofen (ADVIL,MOTRIN) 200 MG tablet Take 200 mg by mouth every 6 (six) hours as needed., Historical Med    Multiple Vitamin (MULTI-VITAMINS) TABS Take 1 tablet by mouth daily., Historical Med         DISCHARGE INSTRUCTIONS:    The patient is to follow-up with primary care physician within one week after discharge  If you experience worsening of your admission symptoms, develop shortness of breath, life threatening emergency, suicidal or homicidal thoughts you must seek medical attention immediately by calling 911 or calling your MD immediately  if symptoms less severe.  You Must read complete instructions/literature along with all the possible adverse reactions/side effects for all the Medicines you take and that have been prescribed to you. Take any new Medicines after you have completely understood and accept all the  possible adverse reactions/side effects.   Please note  You were cared for by a hospitalist during your hospital stay. If you have any questions about your discharge medications or the care you received while you were in the hospital after you are discharged, you can call the unit and asked to speak with the hospitalist on call if the hospitalist that took care of you is not available. Once you are discharged, your primary care physician will handle any further medical issues. Please note that NO REFILLS for any discharge medications will be authorized once you are discharged, as it is imperative that you return to your primary care physician (or establish a relationship with a primary care physician if you do not have one) for your aftercare needs so that they can reassess your need for medications and monitor your lab values.    Today   CHIEF COMPLAINT:   Chief Complaint  Patient presents with  . Cough    HISTORY OF PRESENT ILLNESS:  Kayla Zamora  is a 71 y.o. female with a known history of thyroid nodules, depression, who presents to the hospital with complaints of shortness of breath, cough, chest congestion. On arrival to the hospital the patient was noted to be hypoxic with O2  sats of 88% on room air on exertion. She admited of dry cough, no significant sputum production. Chest x-ray showed cardiomegaly, pulmonary disease, CT angiogram of the chest revealed left lower lobe pneumonia. Patient was initiated on antibiotics intravenously admitted to the hospital. The patient was felt to have COPD exacerbation as well, requiring albuterol inhalers, steroids. This conservative therapy. Her condition improved and she was stable to be discharged home. She was recommended to continue steroid taper and antibiotic therapy to complete course, she was advised to continue nebs and cough medications. Oxygenation has improved to 94% on room air at rest and on exertion. Discussion by problem: #1. Acute  respiratory distress due to left lower lobe pneumonia, continue oxygen therapy as needed, O2 sats on room air improved. #2 left lower lobe pneumonia, COPD exacerbation, continue antibiotic therapy with Zithromax, unable to get sputum culture, continue duo nebs, medications, Dulera, clinically improved. Patient is to continue steroid taper for COPD exacerbation, nebulizers #3. Elevated troponin, likely demand ischemia, echocardiogram was unremarkable, continue patient metoprolol, aspirin for now #4 6 beats SVT, continue  metoprolol, magnesium level was normal, TSH was low, but free T4 was normal, no recurrences were noted   VITAL SIGNS:  Blood pressure (!) 112/59, pulse 98, temperature 98.1 F (36.7 C), temperature source Oral, resp. rate 17, height 5\' 4"  (1.626 m), weight 77.2 kg (170 lb 3.1 oz), SpO2 94 %.  I/O:   Intake/Output Summary (Last 24 hours) at 12/15/16 1549 Last data filed at 12/15/16 1203  Gross per 24 hour  Intake              850 ml  Output                0 ml  Net              850 ml    PHYSICAL EXAMINATION:  GENERAL:  71 y.o.-year-old patient lying in the bed with no acute distress.  EYES: Pupils equal, round, reactive to light and accommodation. No scleral icterus. Extraocular muscles intact.  HEENT: Head atraumatic, normocephalic. Oropharynx and nasopharynx clear.  NECK:  Supple, no jugular venous distention. No thyroid enlargement, no tenderness.  LUNGS: Normal breath sounds bilaterally, With sitter. Left-sided scattered wheezing, rhonchi were noted, no crepitations . No use of accessory muscles of respiration.  CARDIOVASCULAR: S1, S2 normal. No murmurs, rubs, or gallops.  ABDOMEN: Soft, non-tender, non-distended. Bowel sounds present. No organomegaly or mass.  EXTREMITIES: No pedal edema, cyanosis, or clubbing.  NEUROLOGIC: Cranial nerves II through XII are intact. Muscle strength 5/5 in all extremities. Sensation intact. Gait not checked.  PSYCHIATRIC: The patient  is alert and oriented x 3.  SKIN: No obvious rash, lesion, or ulcer.   DATA REVIEW:   CBC  Recent Labs Lab 12/14/16 0304  WBC 4.6  HGB 12.6  HCT 38.4  PLT 159    Chemistries   Recent Labs Lab 12/14/16 0304 12/14/16 0937  NA 140  --   K 3.7  --   CL 105  --   CO2 25  --   GLUCOSE 158*  --   BUN 17  --   CREATININE 0.77  --   CALCIUM 9.0  --   MG  --  2.2  AST 39  --   ALT 37  --   ALKPHOS 89  --   BILITOT 0.4  --     Cardiac Enzymes  Recent Labs Lab 12/14/16 [place] <0.03    Microbiology  Results  No results found for this or any previous visit.  RADIOLOGY:  Ct Angio Chest Pe W Or Wo Contrast  Result Date: 12/13/2016 CLINICAL DATA:  One week history of progressive shortness of breath, cough and congestion. EXAM: CT ANGIOGRAPHY CHEST WITH CONTRAST TECHNIQUE: Multidetector CT imaging of the chest was performed using the standard protocol during bolus administration of intravenous contrast. Multiplanar CT image reconstructions and MIPs were obtained to evaluate the vascular anatomy. CONTRAST:  75 cc Isovue 370 COMPARISON:  Chest x-ray same date. FINDINGS: Chest wall: No breast masses, supraclavicular or axillary lymphadenopathy. Small scattered lymph nodes are noted. Right-sided multinodular thyroid goiter. The left thyroid lobe is not visualized. Correlation with thyroid ultrasound may be helpful. Cardiovascular: The heart is within normal limits in size for age. No pericardial effusion. The aorta is normal in caliber. No dissection. The branch vessels are patent. No obvious coronary artery calcifications. The pulmonary arterial tree is fairly well opacified. No filling defects to suggest pulmonary embolism. Mediastinum/Nodes: Borderline mediastinal and hilar lymph nodes likely related to the lung findings. The esophagus is grossly normal. Lungs/Pleura: Patchy left lower lobe airspace process consistent with pneumonia. Minimal right basilar atelectasis. No  worrisome pulmonary lesions or pleural effusion. Upper Abdomen: No significant upper abdominal findings. Suspect small gallstone in the gallbladder. Musculoskeletal: No significant bony findings. Review of the MIP images confirms the above findings. IMPRESSION: 1. No CT findings for pulmonary embolism. 2. Normal thoracic aorta. 3. Left lower lobe pneumonia. Electronically Signed   By: Marijo Sanes M.D.   On: 12/13/2016 17:15    EKG:   Orders placed or performed during the hospital encounter of 12/13/16  . ED EKG  . ED EKG      Management plans discussed with the patient, family and they are in agreement.  CODE STATUS:     Code Status Orders        Start     Ordered   12/13/16 1639  Full code  Continuous     12/13/16 1638    Code Status History    Date Active Date Inactive Code Status Order ID Comments User Context   This patient has a current code status but no historical code status.    Advance Directive Documentation   Flowsheet Row Most Recent Value  Type of Advance Directive  Healthcare Power of Attorney  Pre-existing out of facility DNR order (yellow form or pink MOST form)  No data  "MOST" Form in Place?  No data      TOTAL TIME TAKING CARE OF THIS PATIENT: 40 minutes.    Theodoro Grist M.D on 12/15/2016 at 3:49 PM  Between 7am to 6pm - Pager - [phone]  After 6pm go to [email or web address] - password [email or web address]  [place] [facility]  Office  [phone]  CC: Primary care physician; No primary care provider on file.

## 2016-12-15 NOTE — Care Management (Signed)
Patient admitted with respiratory distress due to LLL PNA.  Patient to discharge with nebulizer.  Nebulizer to be delivered to room prior to discharge by Corene Cornea with [facility].  PT has evaluated patient and does not recommend any PT follow up.  Patient has been weaned to RA. RNCM signing off.

## 2016-12-15 NOTE — Progress Notes (Signed)
*  PRELIMINARY RESULTS* Echocardiogram 2D Echocardiogram has been performed.  Kayla Zamora 12/15/2016, 8:46 AM

## 2016-12-17 DIAGNOSIS — Z634 Disappearance and death of family member: Secondary | ICD-10-CM | POA: Insufficient documentation

## 2018-02-15 ENCOUNTER — Ambulatory Visit
Admission: EM | Admit: 2018-02-15 | Discharge: 2018-02-15 | Disposition: A | Discharge status: Home / Self Care | Payer: Medicare Other | Attending: Family Medicine | Admitting: Family Medicine

## 2018-02-15 ENCOUNTER — Other Ambulatory Visit: Payer: Self-pay

## 2018-02-15 DIAGNOSIS — H1033 Unspecified acute conjunctivitis, bilateral: Secondary | ICD-10-CM | POA: Diagnosis not present

## 2018-02-15 DIAGNOSIS — T07XXXA Unspecified multiple injuries, initial encounter: Secondary | ICD-10-CM | POA: Diagnosis not present

## 2018-02-15 DIAGNOSIS — W57XXXA Bitten or stung by nonvenomous insect and other nonvenomous arthropods, initial encounter: Secondary | ICD-10-CM

## 2018-02-15 MED ORDER — POLYMYXIN B-TRIMETHOPRIM 10000-0.1 UNIT/ML-% OP SOLN: 1.0000 [drp] | Freq: Four times a day (QID) | OPHTHALMIC | 0 refills | Status: AC

## 2018-02-15 MED ORDER — DOXYCYCLINE HYCLATE 100 MG PO CAPS: 100.0000 mg | ORAL_CAPSULE | Freq: Two times a day (BID) | ORAL | 0 refills | Status: DC

## 2018-02-15 NOTE — ED Triage Notes (Addendum)
Pt reports removing ticks from her body 3 times in the past week. Today states she has itchy, swollen eyes and a sore neck. Pt reports the past 3 summers she had to be treated with Doxycycline for same.

## 2018-02-15 NOTE — Discharge Instructions (Signed)
Medications as prescribed. ° °Take care ° °Dr. Abdulkarim Eberlin  °

## 2018-02-15 NOTE — ED Provider Notes (Signed)
[facility]    CSN: [id number] Arrival date & time: 02/15/18  2536  History   Chief Complaint Chief Complaint  Patient presents with  . Insect Bite   HPI  72 year old female presents with recent tick bites.  Patient states that she has been bit several times over the past week.  She reports that she has a sore neck.  She is concerned that she may have a tickborne illness.  Patient states that she has had a similar occurrence previously in past seasons and was treated with doxycycline.  She is requesting doxycycline today.  Additionally, patient states that she is battling conjunctivitis.  She has itchy, swollen eyes.  No reports of fever.  No known exacerbating or relieving factors.  No other associated symptoms.  No other complaints.  Past Medical History:  Diagnosis Date  . Depression   . Hypothyroidism    Patient Active Problem List   Diagnosis Date Noted  . COPD exacerbation ([place]) 12/15/2016  . Elevated troponin 12/15/2016  . SVT (supraventricular tachycardia) ([place]) 12/15/2016  . Pneumonia 12/14/2016  . Acute respiratory insufficiency 12/13/2016  . Left lower lobe pneumonia ([place]) 12/13/2016  . Chest pain 12/13/2016  . Persistent cough 12/13/2016   Past Surgical History:  Procedure Laterality Date  . CESAREAN SECTION    . THYROIDECTOMY, PARTIAL     OB History   None    Home Medications    Prior to Admission medications   Medication Sig Start Date End Date Taking? Authorizing Provider  meloxicam (MOBIC) 7.5 MG tablet Take by mouth. 05/20/17  Yes [provider]  aspirin EC 81 MG tablet Take 81 mg by mouth daily.    [provider]  citalopram (CELEXA) 10 MG tablet Take 15 mg by mouth daily. 10/21/16   [provider]  doxycycline (VIBRAMYCIN) 100 MG capsule Take 1 capsule (100 mg total) by mouth 2 (two) times daily. 02/15/18   Coral Spikes, DO  ibuprofen (ADVIL,MOTRIN) 200 MG tablet Take 200 mg by mouth every 6 (six) hours as  needed.    [provider]  mometasone-formoterol (DULERA) 200-5 MCG/ACT AERO Inhale 2 puffs into the lungs 2 (two) times daily. 12/15/16   Theodoro Grist, MD  trimethoprim-polymyxin b (POLYTRIM) ophthalmic solution Place 1 drop into both eyes every 6 (six) hours for 5 days. 02/15/18 02/20/18  Coral Spikes, DO    Family History Family History  Problem Relation Age of Onset  . Stroke Mother   . Dementia Mother   . Lung cancer Father     Social History Social History   Tobacco Use  . Smoking status: Never Smoker  . Smokeless tobacco: Never Used  Substance Use Topics  . Alcohol use: Yes    Alcohol/week: 1.2 oz    Types: 2 Cans of beer per week  . Drug use: No     Allergies   Levofloxacin; Penicillins; and Sulfa antibiotics   Review of Systems Review of Systems  Eyes: Positive for pain and itching.  Musculoskeletal: Positive for neck pain.  Skin:       Tick bites.   Physical Exam Triage Vital Signs ED Triage Vitals  Enc Vitals Group     BP 02/15/18 1015 134/82     Pulse Rate 02/15/18 1015 85     Resp 02/15/18 1015 18     Temp 02/15/18 1015 98.2 F (36.8 C)     Temp Source 02/15/18 1015 Oral     SpO2 02/15/18 1015 97 %  Weight 02/15/18 1017 167 lb (75.8 kg)     Height 02/15/18 1017 5\' 4"  (1.626 m)     Head Circumference --      Peak Flow --      Pain Score 02/15/18 1014 1     Pain Loc --      Pain Edu? --      Excl. in [place]? --    Updated Vital Signs BP 134/82 (BP Location: Right Arm)   Pulse 85   Temp 98.2 F (36.8 C) (Oral)   Resp 18   Ht 5\' 4"  (1.626 m)   Wt 167 lb (75.8 kg)   SpO2 97%   BMI 28.67 kg/m    Physical Exam  Constitutional: She is oriented to person, place, and time. She appears well-developed. No distress.  Eyes: Pupils are equal, round, and reactive to light. Right eye exhibits no discharge. Left eye exhibits no discharge.  Mild conjunctival injection bilaterally.  Cardiovascular: Normal rate and regular rhythm.    Pulmonary/Chest: Effort normal and breath sounds normal. She has no wheezes. She has no rales.  Neurological: She is alert and oriented to person, place, and time.  Psychiatric: She has a normal mood and affect. Her behavior is normal.  Nursing note and vitals reviewed.  UC Treatments / Results  Labs (all labs ordered are listed, but only abnormal results are displayed) Labs Reviewed - No data to display  EKG None  Radiology No results found.  Procedures Procedures (including critical care time)  Medications Ordered in UC Medications - No data to display  Initial Impression / Assessment and Plan / UC Course  I have reviewed the triage vital signs and the nursing notes.  Pertinent labs & imaging results that were available during my care of the patient were reviewed by me and considered in my medical decision making (see chart for details).    72 year old female presents with tick bites.  Patient requesting doxycycline.  Given reported symptoms, will place on doxycycline.  Placing on Polytrim for conjunctivitis.  Final Clinical Impressions(s) / UC Diagnoses   Final diagnoses:  Tick bite, initial encounter  Acute conjunctivitis of both eyes, unspecified acute conjunctivitis type   ED Prescriptions    Medication Sig Dispense Auth. Provider   doxycycline (VIBRAMYCIN) 100 MG capsule Take 1 capsule (100 mg total) by mouth 2 (two) times daily. 14 capsule Deveion Denz G, DO   trimethoprim-polymyxin b (POLYTRIM) ophthalmic solution Place 1 drop into both eyes every 6 (six) hours for 5 days. 10 mL Coral Spikes, DO     Controlled Substance Prescriptions [place] Controlled Substance Registry consulted? Not Applicable   Coral Spikes, DO 02/15/18 1112

## 2018-03-04 ENCOUNTER — Telehealth: Payer: Self-pay | Admitting: Emergency Medicine

## 2018-03-04 NOTE — Telephone Encounter (Signed)
Patient called in stating that she was seen and treated on 02/15/18 with Doxycycline for 1 week for multiple tick bites. Patient states she has finished Doxy, but still having symptoms that she thinks is associated with the tick bites and requesting refill on Doxy. Advised patient that she would need to be re-evaluated by PCP or Urgent Care. Patient agreed and voiced understanding.

## 2018-03-28 ENCOUNTER — Encounter: Payer: Self-pay | Admitting: Gynecology

## 2018-03-28 ENCOUNTER — Ambulatory Visit
Admission: EM | Admit: 2018-03-28 | Discharge: 2018-03-28 | Disposition: A | Discharge status: Home / Self Care | Payer: Medicare Other | Attending: Internal Medicine | Admitting: Internal Medicine

## 2018-03-28 DIAGNOSIS — M791 Myalgia, unspecified site: Secondary | ICD-10-CM | POA: Diagnosis not present

## 2018-03-28 DIAGNOSIS — Z8619 Personal history of other infectious and parasitic diseases: Secondary | ICD-10-CM

## 2018-03-28 DIAGNOSIS — M542 Cervicalgia: Secondary | ICD-10-CM | POA: Diagnosis not present

## 2018-03-28 DIAGNOSIS — M2550 Pain in unspecified joint: Secondary | ICD-10-CM | POA: Diagnosis not present

## 2018-03-28 LAB — CBC WITH DIFFERENTIAL/PLATELET
BASOS ABS: 0.1 10*3/uL (ref 0–0.1)
Basophils Relative: 1 %
EOS ABS: 0.1 10*3/uL (ref 0–0.7)
EOS PCT: 2 %
HCT: 40.6 % (ref 35.0–47.0)
Hemoglobin: 13.4 g/dL (ref 12.0–16.0)
Lymphocytes Relative: 22 %
Lymphs Abs: 1.4 10*3/uL (ref 1.0–3.6)
MCH: 29.8 pg (ref 26.0–34.0)
MCHC: 32.9 g/dL (ref 32.0–36.0)
MCV: 90.6 fL (ref 80.0–100.0)
Monocytes Absolute: 0.4 10*3/uL (ref 0.2–0.9)
Monocytes Relative: 7 %
NEUTROS PCT: 68 %
Neutro Abs: 4.3 10*3/uL (ref 1.4–6.5)
PLATELETS: 193 10*3/uL (ref 150–440)
RBC: 4.48 MIL/uL (ref 3.80–5.20)
RDW: 13.3 % (ref 11.5–14.5)
WBC: 6.2 10*3/uL (ref 3.6–11.0)

## 2018-03-28 MED ORDER — KETOROLAC TROMETHAMINE 60 MG/2ML IM SOLN
60.0000 mg | Freq: Once | INTRAMUSCULAR | Status: AC
  Administered 2018-03-28: 60 mg via INTRAMUSCULAR

## 2018-03-28 NOTE — Discharge Instructions (Addendum)
-  Drink plenty of water over the next 24 hours. -Obtaining labwork for evaluation of Lyme disease and RMSF. -Call next week for an update on labwork obtained. -If symptoms continue to worsen or fail to improve follow-up with either PCP or [facility].

## 2018-03-28 NOTE — ED Provider Notes (Signed)
[facility]    CSN: [id number] Arrival date & time: 03/28/18  1228     History   Chief Complaint Chief Complaint  Patient presents with  . Torticollis    HPI Kayla Zamora is a 72 y.o. female who presents today for neck stiffness and multiple joint pain.  The patient has a history of tick bites in the past, most recently 1 month ago she was evaluated and was prescribed a one-week dose of doxycycline.  She has taken this medication in the past and has been able to tolerate it well.  However over the last month she reports increased neck stiffness and joint pain.  She denies any rash or fevers at home.  She does report a headache however denies any vision changes and denies any sudden changes in this headache recently.  She has taken Tylenol as well as Mobic at home with mild relief.  Denies any confusion.  She does have a history of knee osteoarthritis.  HPI  Past Medical History:  Diagnosis Date  . Depression   . Hypothyroidism     Patient Active Problem List   Diagnosis Date Noted  . COPD exacerbation ([place]) 12/15/2016  . Elevated troponin 12/15/2016  . SVT (supraventricular tachycardia) ([place]) 12/15/2016  . Pneumonia 12/14/2016  . Acute respiratory insufficiency 12/13/2016  . Left lower lobe pneumonia ([place]) 12/13/2016  . Chest pain 12/13/2016  . Persistent cough 12/13/2016    Past Surgical History:  Procedure Laterality Date  . CESAREAN SECTION    . THYROIDECTOMY, PARTIAL      OB History   None      Home Medications    Prior to Admission medications   Medication Sig Start Date End Date Taking? Authorizing Provider  aspirin EC 81 MG tablet Take 81 mg by mouth daily.   Yes [provider]  citalopram (CELEXA) 10 MG tablet Take 15 mg by mouth daily. 10/21/16  Yes [provider]  ibuprofen (ADVIL,MOTRIN) 200 MG tablet Take 200 mg by mouth every 6 (six) hours as needed.   Yes [provider]  meloxicam (MOBIC) 7.5 MG tablet  Take by mouth. 05/20/17  Yes [provider]  mometasone-formoterol (DULERA) 200-5 MCG/ACT AERO Inhale 2 puffs into the lungs 2 (two) times daily. 12/15/16  Yes Theodoro Grist, MD  doxycycline (VIBRAMYCIN) 100 MG capsule Take 1 capsule (100 mg total) by mouth 2 (two) times daily. 02/15/18   Coral Spikes, DO    Family History Family History  Problem Relation Age of Onset  . Stroke Mother   . Dementia Mother   . Lung cancer Father     Social History Social History   Tobacco Use  . Smoking status: Never Smoker  . Smokeless tobacco: Never Used  Substance Use Topics  . Alcohol use: Yes    Alcohol/week: 1.2 oz    Types: 2 Cans of beer per week  . Drug use: No     Allergies   Levofloxacin; Penicillins; and Sulfa antibiotics   Review of Systems Review of Systems  Constitutional: Positive for fatigue. Negative for fever.  Musculoskeletal: Positive for arthralgias, myalgias and neck pain.  Skin: Negative for color change and rash.  All other systems reviewed and are negative.  Physical Exam Triage Vital Signs ED Triage Vitals  Enc Vitals Group     BP 03/28/18 1247 (!) 119/93     Pulse Rate 03/28/18 1247 78     Resp 03/28/18 1247 16     Temp  03/28/18 1247 98.5 F (36.9 C)     Temp Source 03/28/18 1247 Oral     SpO2 --      Weight 03/28/18 1248 171 lb (77.6 kg)     Height --      Head Circumference --      Peak Flow --      Pain Score 03/28/18 1248 6     Pain Loc --      Pain Edu? --      Excl. in [place]? --    No data found.  Updated Vital Signs BP (!) 119/93   Pulse 78   Temp 98.5 F (36.9 C) (Oral)   Resp 16   Wt 171 lb (77.6 kg)   BMI 29.35 kg/m   Visual Acuity Right Eye Distance:   Left Eye Distance:   Bilateral Distance:    Right Eye Near:   Left Eye Near:    Bilateral Near:     Physical Exam Cervical Spine: Examination of the cervical spine reveals no bony abnormality, no edema, and no ecchymosis.  No rash can be identified.  There is no  step-off.  The patient has full active and passive range of motion of the cervical spine with flexion, extension, and right and left bend with rotation.  Mild aching pain at the extremes with all motions.  There is mild crepitus with range of motion exercises.  The patient is non-tender along the spinous process to palpation.  The patient has no paravertebral muscle spasm.  There is mild bilateral parascapular discomfort.  The patient has a negative axial compression test.  The patient has a negative Spurling test.  The patient has a negative overhead arm test for thoracic outlet syndrome.    Bilateral Upper Extremity: Examination of the bilateral shoulder and arm showed no bony abnormality or edema.  The patient has normal active and passive motion with abduction, flexion, internal rotation, and external rotation.  Mild pain with resisted abduction of her shoulders. The patient has no tenderness with motion.  The patient has a negative Hawkins test and a negative impingement test.  The patient has a negative drop arm test.  The patient is non-tender along the deltoid muscle.  There is no subacromial space tenderness with no AC joint tenderness.  The patient has no instability of the shoulder with anterior-posterior motion.  There is a negative sulcus sign.  The rotator cuff muscle strength is 5/5 with supraspinatus, 5/5 with internal rotation, and 5/5 with external rotation.  There is no crepitus with range of motion activities.       UC Treatments / Results  Labs (all labs ordered are listed, but only abnormal results are displayed) Labs Reviewed  CBC WITH DIFFERENTIAL/PLATELET  ROCKY MTN SPOTTED FVR ABS PNL(IGG+IGM)  B. BURGDORFI ANTIBODIES    EKG None  Radiology No results found.  Procedures Procedures (including critical care time)  Medications Ordered in UC Medications  ketorolac (TORADOL) injection 60 mg (60 mg Intramuscular Given 03/28/18 1343)    Initial Impression / Assessment  and Plan / UC Course  I have reviewed the triage vital signs and the nursing notes.  Pertinent labs & imaging results that were available during my care of the patient were reviewed by me and considered in my medical decision making (see chart for details).     1.  Treatment options discussed today. 2.  While the patient does not have a rash, with a history of tick bites in the past will  perform lab work for evaluation for Lyme disease and [facility] spotted fever.  CBC today was normal. 3.  The patient was offered and received a Toradol injection at today's visit as well.  Encouraged to start back on her Mobic starting tomorrow.  Gradual return back to normal activities. 4.  The patient will contact the clinic in the future for results of her blood work.  Follow-up on as-needed basis at this time Final Clinical Impressions(s) / UC Diagnoses   Final diagnoses:  Multiple joint pain     Discharge Instructions     -Drink plenty of water over the next 24 hours. -Obtaining labwork for evaluation of Lyme disease and RMSF. -Call next week for an update on labwork obtained. -If symptoms continue to worsen or fail to improve follow-up with either PCP or [facility].   ED Prescriptions    None     Controlled Substance Prescriptions [place] Controlled Substance Registry consulted? Not Applicable   Lattie Corns, PA-C 03/28/18 1444

## 2018-03-28 NOTE — ED Triage Notes (Signed)
Per patient with stiff neck x 1 month ago. Per patient stiff neck started after she had a tick bite over a month ago.

## 2018-03-30 ENCOUNTER — Telehealth (HOSPITAL_COMMUNITY): Payer: Self-pay

## 2018-03-30 LAB — B. BURGDORFI ANTIBODIES

## 2018-03-30 NOTE — Telephone Encounter (Signed)
Pt called regarding test results. CBC within normal limits. Awaiting RMSF results. Pt instructed that I would call her with these when they result. Verbalized understanding.

## 2018-03-31 ENCOUNTER — Telehealth (HOSPITAL_COMMUNITY): Payer: Self-pay

## 2018-03-31 LAB — ROCKY MTN SPOTTED FVR ABS PNL(IGG+IGM)
RMSF IgG: NEGATIVE
RMSF IgM: 0.65 index (ref 0.00–0.89)

## 2018-03-31 NOTE — Telephone Encounter (Signed)
Results are within normal range. Attempted to contact patient. No answer at this time.

## 2018-08-30 DIAGNOSIS — M1711 Unilateral primary osteoarthritis, right knee: Secondary | ICD-10-CM | POA: Insufficient documentation

## 2018-08-30 DIAGNOSIS — N3941 Urge incontinence: Secondary | ICD-10-CM | POA: Insufficient documentation

## 2018-08-30 DIAGNOSIS — E042 Nontoxic multinodular goiter: Secondary | ICD-10-CM | POA: Insufficient documentation

## 2019-12-21 DIAGNOSIS — D239 Other benign neoplasm of skin, unspecified: Secondary | ICD-10-CM

## 2019-12-21 HISTORY — DX: Other benign neoplasm of skin, unspecified: D23.9

## 2020-03-06 DIAGNOSIS — H10213 Acute toxic conjunctivitis, bilateral: Secondary | ICD-10-CM | POA: Insufficient documentation

## 2020-10-21 DIAGNOSIS — Z8616 Personal history of COVID-19: Secondary | ICD-10-CM

## 2020-10-21 DIAGNOSIS — U071 COVID-19: Secondary | ICD-10-CM

## 2020-10-21 HISTORY — DX: COVID-19: U07.1

## 2020-10-21 HISTORY — DX: Personal history of COVID-19: Z86.16

## 2020-11-02 ENCOUNTER — Ambulatory Visit
Admission: EM | Admit: 2020-11-02 | Discharge: 2020-11-02 | Disposition: A | Discharge status: Home / Self Care | Payer: Medicare PPO | Attending: Physician Assistant | Admitting: Physician Assistant

## 2020-11-02 ENCOUNTER — Other Ambulatory Visit: Payer: Self-pay

## 2020-11-02 ENCOUNTER — Encounter: Payer: Self-pay | Admitting: Emergency Medicine

## 2020-11-02 ENCOUNTER — Ambulatory Visit: Payer: Self-pay

## 2020-11-02 DIAGNOSIS — W540XXA Bitten by dog, initial encounter: Secondary | ICD-10-CM | POA: Diagnosis not present

## 2020-11-02 DIAGNOSIS — S51851A Open bite of right forearm, initial encounter: Secondary | ICD-10-CM

## 2020-11-02 DIAGNOSIS — S51801A Unspecified open wound of right forearm, initial encounter: Secondary | ICD-10-CM

## 2020-11-02 MED ORDER — CLINDAMYCIN HCL 300 MG PO CAPS: 300.0000 mg | ORAL_CAPSULE | Freq: Four times a day (QID) | ORAL | 0 refills | Status: AC

## 2020-11-02 NOTE — ED Provider Notes (Signed)
[facility]    CSN: [id number] Arrival date & time: 11/02/20  1217      History   Chief Complaint Chief Complaint  Patient presents with   Animal Bite    Right arm    HPI Kayla Zamora is a 75 y.o. female presenting for dog bite of the left forearm. She says her new dachshund puppy got into a fight with its father and she went to break the fight up when she got bit by the older dog. She says all dogs are up to date with rabies vaccinations. She is UTD with tetanus.  She says she has cleaned the area.  This occurred approximately 20-21 hours ago.  She does admit to some pain in the area but denies any numbness, weakness or tingling.  Denies any bleeding disorders.  Patient not immunocompromise.  Her past medical history is significant for COPD, hypothyroidism, and depression.  She does admit recent history of COVID-19 on January 06/2021 was diagnosed with date.  Patient fully recovered from COVID-19.  She has no other complaints or concerns today.  HPI  Past Medical History:  Diagnosis Date   COVID-19 10/21/2020   Depression    Hypothyroidism     Patient Active Problem List   Diagnosis Date Noted   COPD exacerbation ([place]) 12/15/2016   Elevated troponin 12/15/2016   SVT (supraventricular tachycardia) ([place]) 12/15/2016   Pneumonia 12/14/2016   Acute respiratory insufficiency 12/13/2016   Left lower lobe pneumonia 12/13/2016   Chest pain 12/13/2016   Persistent cough 12/13/2016    Past Surgical History:  Procedure Laterality Date   CESAREAN SECTION     THYROIDECTOMY, PARTIAL      OB History   No obstetric history on file.      Home Medications    Prior to Admission medications   Medication Sig Start Date End Date Taking? Authorizing Provider  citalopram (CELEXA) 10 MG tablet Take 15 mg by mouth daily. 10/21/16  Yes [provider]  clindamycin (CLEOCIN) 300 MG capsule Take 1 capsule (300 mg total) by mouth 4 (four) times daily for  5 days. 11/02/20 11/07/20 Yes Danton Clap, PA-C  doxycycline (VIBRAMYCIN) 100 MG capsule Take 1 capsule (100 mg total) by mouth 2 (two) times daily. 02/15/18  Yes Cook, Jayce G, DO  meloxicam (MOBIC) 7.5 MG tablet Take by mouth. 05/20/17  Yes [provider]  aspirin EC 81 MG tablet Take 81 mg by mouth daily.    [provider]  ibuprofen (ADVIL,MOTRIN) 200 MG tablet Take 200 mg by mouth every 6 (six) hours as needed.    [provider]  mometasone-formoterol (DULERA) 200-5 MCG/ACT AERO Inhale 2 puffs into the lungs 2 (two) times daily. 12/15/16   Theodoro Grist, MD    Family History Family History  Problem Relation Age of Onset   Stroke Mother    Dementia Mother    Lung cancer Father     Social History Social History   Tobacco Use   Smoking status: Never Smoker   Smokeless tobacco: Never Used  Scientific laboratory technician Use: Never used  Substance Use Topics   Alcohol use: Yes    Alcohol/week: 2.0 standard drinks    Types: 2 Cans of beer per week   Drug use: No     Allergies   Levofloxacin, Penicillins, and Sulfa antibiotics   Review of Systems Review of Systems  Constitutional: Negative for fatigue and fever.  Musculoskeletal: Negative for arthralgias, joint swelling  and myalgias.  Skin: Positive for wound.  Neurological: Negative for weakness and numbness.     Physical Exam Triage Vital Signs ED Triage Vitals  Enc Vitals Group     BP 11/02/20 1238 119/68     Pulse Rate 11/02/20 1238 77     Resp 11/02/20 1238 16     Temp 11/02/20 1238 98.3 F (36.8 C)     Temp Source 11/02/20 1238 Oral     SpO2 11/02/20 1238 100 %     Weight 11/02/20 1239 165 lb (74.8 kg)     Height 11/02/20 1239 5\' 4"  (1.626 m)     Head Circumference --      Peak Flow --      Pain Score 11/02/20 1238 1     Pain Loc --      Pain Edu? --      Excl. in [place]? --    No data found.  Updated Vital Signs BP 119/68 (BP Location: Right Arm)    Pulse 77    Temp 98.3 F  (36.8 C) (Oral)    Resp 16    Ht 5\' 4"  (1.626 m)    Wt 165 lb (74.8 kg)    SpO2 100%    BMI 28.32 kg/m    Physical Exam Vitals and nursing note reviewed.  Constitutional:      General: She is not in acute distress.    Appearance: Normal appearance. She is not ill-appearing or toxic-appearing.  HENT:     Head: Normocephalic and atraumatic.  Eyes:     General: No scleral icterus.       Right eye: No discharge.        Left eye: No discharge.     Conjunctiva/sclera: Conjunctivae normal.  Cardiovascular:     Rate and Rhythm: Normal rate and regular rhythm.     Heart sounds: Normal heart sounds.  Pulmonary:     Effort: Pulmonary effort is normal. No respiratory distress.     Breath sounds: Normal breath sounds.  Musculoskeletal:     Cervical back: Neck supple.  Skin:    General: Skin is dry.     Findings: Lesion (puncture bite wound right forearm. Relatively superficial. No bleeding or contamination. Area is diffusely tender) present.  Neurological:     General: No focal deficit present.     Mental Status: She is alert. Mental status is at baseline.     Motor: No weakness.     Gait: Gait normal.  Psychiatric:        Mood and Affect: Mood normal.        Behavior: Behavior normal.        Thought Content: Thought content normal.      UC Treatments / Results  Labs (all labs ordered are listed, but only abnormal results are displayed) Labs Reviewed - No data to display  EKG   Radiology No results found.  Procedures Procedures (including critical care time)  Medications Ordered in UC Medications - No data to display  Initial Impression / Assessment and Plan / UC Course  I have reviewed the triage vital signs and the nursing notes.  Pertinent labs & imaging results that were available during my care of the patient were reviewed by me and considered in my medical decision making (see chart for details).    75 year old female presenting for dog bite of her right  forearm.  Area cleansed with chlorhexidine and saline.  Applied bacitracin and nonadherent pad with Coban.  Patient tolerated well.  Tetanus is already up-to-date.  Notified animal control about the bite.  Discussed with patient wound care.  Sent clindamycin to try to prevent infection.  Patient does have allergy to penicillin, sulfa, and Levaquin.  Advised her to follow-up with our department as needed for any new or worsening symptoms.  Patient agreeable.   Final Clinical Impressions(s) / UC Diagnoses   Final diagnoses:  Dog bite of right forearm, initial encounter  Open wound of right forearm, initial encounter     Discharge Instructions     Keep area clean and dry.  Clean with soap and water and then apply Neosporin ointment.  Take antibiotics to try to prevent infection, but if you do have increased pain, swelling, redness or pustular drainage from the bite wound you should be seen again.  You can also elevate and ice the area and take ibuprofen and Tylenol for pain relief.    ED Prescriptions    Medication Sig Dispense Auth. Provider   clindamycin (CLEOCIN) 300 MG capsule Take 1 capsule (300 mg total) by mouth 4 (four) times daily for 5 days. 20 capsule Danton Clap, PA-C     PDMP not reviewed this encounter.   Danton Clap, PA-C 11/02/20 [id number]

## 2020-11-02 NOTE — ED Triage Notes (Signed)
Pt presents today with dog bite to right forearm. She was bit last evening by her puppy whose shots are up to date.She also tested positve for Covid on 10/21/20.

## 2020-11-02 NOTE — ED Notes (Signed)
Patient reports pet's rabies in 2020

## 2020-11-02 NOTE — Discharge Instructions (Signed)
Keep area clean and dry.  Clean with soap and water and then apply Neosporin ointment.  Take antibiotics to try to prevent infection, but if you do have increased pain, swelling, redness or pustular drainage from the bite wound you should be seen again.  You can also elevate and ice the area and take ibuprofen and Tylenol for pain relief.

## 2020-11-08 DIAGNOSIS — H04223 Epiphora due to insufficient drainage, bilateral lacrimal glands: Secondary | ICD-10-CM | POA: Insufficient documentation

## 2020-11-08 DIAGNOSIS — H04412 Chronic dacryocystitis of left lacrimal passage: Secondary | ICD-10-CM | POA: Insufficient documentation

## 2020-11-08 DIAGNOSIS — H04553 Acquired stenosis of bilateral nasolacrimal duct: Secondary | ICD-10-CM | POA: Insufficient documentation

## 2020-12-26 ENCOUNTER — Encounter: Payer: Medicare Other | Admitting: Dermatology

## 2021-02-25 ENCOUNTER — Other Ambulatory Visit: Payer: Self-pay

## 2021-02-25 ENCOUNTER — Ambulatory Visit (INDEPENDENT_AMBULATORY_CARE_PROVIDER_SITE_OTHER): Payer: Medicare PPO

## 2021-02-25 ENCOUNTER — Ambulatory Visit
Admission: EM | Admit: 2021-02-25 | Discharge: 2021-02-25 | Disposition: A | Discharge status: Home / Self Care | Payer: Medicare PPO | Attending: Family Medicine | Admitting: Family Medicine

## 2021-02-25 DIAGNOSIS — R0602 Shortness of breath: Secondary | ICD-10-CM | POA: Diagnosis not present

## 2021-02-25 DIAGNOSIS — R059 Cough, unspecified: Secondary | ICD-10-CM | POA: Diagnosis not present

## 2021-02-25 DIAGNOSIS — Z20822 Contact with and (suspected) exposure to covid-19: Secondary | ICD-10-CM | POA: Insufficient documentation

## 2021-02-25 DIAGNOSIS — J441 Chronic obstructive pulmonary disease with (acute) exacerbation: Secondary | ICD-10-CM | POA: Diagnosis not present

## 2021-02-25 DIAGNOSIS — I700 Atherosclerosis of aorta: Secondary | ICD-10-CM | POA: Diagnosis not present

## 2021-02-25 DIAGNOSIS — Z7951 Long term (current) use of inhaled steroids: Secondary | ICD-10-CM | POA: Diagnosis not present

## 2021-02-25 LAB — RESP PANEL BY RT-PCR (FLU A&B, COVID) ARPGX2
Influenza A by PCR: NEGATIVE
Influenza B by PCR: NEGATIVE
SARS Coronavirus 2 by RT PCR: NEGATIVE

## 2021-02-25 MED ORDER — PREDNISONE 50 MG PO TABS: ORAL_TABLET | ORAL | 0 refills | Status: DC

## 2021-02-25 MED ORDER — DOXYCYCLINE HYCLATE 100 MG PO CAPS: 100.0000 mg | ORAL_CAPSULE | Freq: Two times a day (BID) | ORAL | 0 refills | Status: DC

## 2021-02-25 MED ORDER — BENZONATATE 200 MG PO CAPS: 200.0000 mg | ORAL_CAPSULE | Freq: Three times a day (TID) | ORAL | 0 refills | Status: DC | PRN

## 2021-02-25 NOTE — Discharge Instructions (Signed)
Medication as prescribed.  Take care  Dr. Tylasia Fletchall  

## 2021-02-25 NOTE — ED Provider Notes (Signed)
[facility]    CSN: [id number] Arrival date & time: 02/25/21  1006      History   Chief Complaint Chief Complaint  Patient presents with  . Headache   HPI  75 year old female presents with respiratory symptoms.  Patient reports that she developed symptoms on Thursday.  She states that she initially had scratchy throat.  Subsequently developed headache.  Has now developed cough and chest congestion.  She has very coarse/harsh cough.  Has a history pneumonia.  Has had a fever as of yesterday morning, T-max 100.7.  She does note body aches as well.  No relieving factors.  She has taken some over-the-counter medication without resolution.  Has a remote history of COPD.  Has never been a smoker.  Has had secondhand smoke exposure.   Past Medical History:  Diagnosis Date  . COVID-19 10/21/2020  . Depression   . Dysplastic nevus 12/21/2019   L lat waistline - moderate  . Dysplastic nevus 12/21/2019   L ant waistline near groin - moderate  . Hypothyroidism     Patient Active Problem List   Diagnosis Date Noted  . COPD exacerbation ([place]) 12/15/2016  . Elevated troponin 12/15/2016  . SVT (supraventricular tachycardia) ([place]) 12/15/2016  . Pneumonia 12/14/2016  . Acute respiratory insufficiency 12/13/2016  . Left lower lobe pneumonia 12/13/2016  . Chest pain 12/13/2016  . Persistent cough 12/13/2016    Past Surgical History:  Procedure Laterality Date  . CESAREAN SECTION    . THYROIDECTOMY, PARTIAL      OB History   No obstetric history on file.      Home Medications    Prior to Admission medications   Medication Sig Start Date End Date Taking? Authorizing Provider  benzonatate (TESSALON) 200 MG capsule Take 1 capsule (200 mg total) by mouth 3 (three) times daily as needed for cough. 02/25/21  Yes Gabriella Guile G, DO  citalopram (CELEXA) 10 MG tablet Take 15 mg by mouth daily. 10/21/16  Yes [provider]  doxycycline (VIBRAMYCIN) 100 MG capsule Take  1 capsule (100 mg total) by mouth 2 (two) times daily. 02/25/21  Yes Wende Longstreth G, DO  predniSONE (DELTASONE) 50 MG tablet 1 tablet daily x 5 days 02/25/21  Yes Kaylon Laroche G, DO  mometasone-formoterol (DULERA) 200-5 MCG/ACT AERO Inhale 2 puffs into the lungs 2 (two) times daily. 12/15/16 02/25/21  Theodoro Grist, MD    Family History Family History  Problem Relation Age of Onset  . Stroke Mother   . Dementia Mother   . Lung cancer Father     Social History Social History   Tobacco Use  . Smoking status: Never Smoker  . Smokeless tobacco: Never Used  Vaping Use  . Vaping Use: Never used  Substance Use Topics  . Alcohol use: Yes    Alcohol/week: 2.0 standard drinks    Types: 2 Cans of beer per week  . Drug use: No     Allergies   Levofloxacin, Penicillins, and Sulfa antibiotics   Review of Systems Review of Systems  Constitutional: Positive for fever.  HENT: Positive for sore throat.   Respiratory: Positive for cough and shortness of breath.   Musculoskeletal:       Body aches.  Neurological: Positive for headaches.   Physical Exam Triage Vital Signs ED Triage Vitals  Enc Vitals Group     BP 02/25/21 1024 118/63     Pulse Rate 02/25/21 1024 91     Resp 02/25/21 1024 20  Temp 02/25/21 1024 98.4 F (36.9 C)     Temp Source 02/25/21 1024 Oral     SpO2 02/25/21 1024 93 %     Weight 02/25/21 1024 171 lb (77.6 kg)     Height 02/25/21 1024 5\' 4"  (1.626 m)     Head Circumference --      Peak Flow --      Pain Score 02/25/21 1023 5     Pain Loc --      Pain Edu? --      Excl. in [place]? --    Updated Vital Signs BP 118/63 (BP Location: Left Arm)   Pulse 91   Temp 98.4 F (36.9 C) (Oral)   Resp 20   Ht 5\' 4"  (1.626 m)   Wt 77.6 kg   SpO2 95%   BMI 29.35 kg/m   Visual Acuity Right Eye Distance:   Left Eye Distance:   Bilateral Distance:    Right Eye Near:   Left Eye Near:    Bilateral Near:     Physical Exam Constitutional:      General: She is not  in acute distress.    Appearance: Normal appearance. She is not ill-appearing.  HENT:     Head: Normocephalic and atraumatic.     Mouth/Throat:     Pharynx: Oropharynx is clear. No oropharyngeal exudate or posterior oropharyngeal erythema.  Eyes:     General:        Right eye: No discharge.        Left eye: No discharge.     Conjunctiva/sclera: Conjunctivae normal.  Cardiovascular:     Rate and Rhythm: Normal rate and regular rhythm.  Pulmonary:     Effort: Pulmonary effort is normal.     Breath sounds: Rales present.  Neurological:     Mental Status: She is alert.  Psychiatric:        Mood and Affect: Mood normal.        Behavior: Behavior normal.    UC Treatments / Results  Labs (all labs ordered are listed, but only abnormal results are displayed) Labs Reviewed  RESP PANEL BY RT-PCR (FLU A&B, COVID) [id number]    EKG   Radiology DG Chest 2 View  Result Date: 02/25/2021 CLINICAL DATA:  Cough, shortness of breath EXAM: CHEST - 2 VIEW COMPARISON:  12/13/2016 FINDINGS: Heart and mediastinal contours are within normal limits. No focal opacities or effusions. No acute bony abnormality. Aortic atherosclerosis. IMPRESSION: No active cardiopulmonary disease. Electronically Signed   By: Rolm Baptise M.D.   On: 02/25/2021 11:40    Procedures Procedures (including critical care time)  Medications Ordered in UC Medications - No data to display  Initial Impression / Assessment and Plan / UC Course  I have reviewed the triage vital signs and the nursing notes.  Pertinent labs & imaging results that were available during my care of the patient were reviewed by me and considered in my medical decision making (see chart for details).    75 year old female presents with respiratory complaints.  Flu negative.  COVID-negative.  Chest x-ray was obtained and was independent reviewed by me.  Interpretation: No evidence of pneumonia.  Chart reflects the patient has a history of COPD.  I  believe that she has respiratory infection which is exacerbating her underlying COPD.  Placing on prednisone, doxycycline, Tessalon Perles.  Final Clinical Impressions(s) / UC Diagnoses   Final diagnoses:  COPD exacerbation [facility])     Discharge Instructions  Medication as prescribed.  Take care  Dr. Lacinda Axon    ED Prescriptions    Medication Sig Dispense Auth. Provider   predniSONE (DELTASONE) 50 MG tablet 1 tablet daily x 5 days 5 tablet Meshia Rau G, DO   doxycycline (VIBRAMYCIN) 100 MG capsule Take 1 capsule (100 mg total) by mouth 2 (two) times daily. 14 capsule Jaydan Meidinger G, DO   benzonatate (TESSALON) 200 MG capsule Take 1 capsule (200 mg total) by mouth 3 (three) times daily as needed for cough. 30 capsule Coral Spikes, DO     PDMP not reviewed this encounter.   Coral Spikes, [place] 02/25/21 1201

## 2021-02-25 NOTE — ED Triage Notes (Signed)
Pt c/o scratchy throat, headache, cough, chest congestion. Started Thursday night, had booster shot Friday morning. Took at home covid test at home which was negative.

## 2021-02-28 ENCOUNTER — Encounter: Payer: Self-pay | Admitting: Emergency Medicine

## 2021-02-28 ENCOUNTER — Ambulatory Visit
Admission: EM | Admit: 2021-02-28 | Discharge: 2021-02-28 | Disposition: A | Discharge status: Home / Self Care | Payer: Medicare PPO | Attending: Emergency Medicine | Admitting: Emergency Medicine

## 2021-02-28 ENCOUNTER — Other Ambulatory Visit: Payer: Self-pay

## 2021-02-28 DIAGNOSIS — R0602 Shortness of breath: Secondary | ICD-10-CM | POA: Diagnosis not present

## 2021-02-28 DIAGNOSIS — I4891 Unspecified atrial fibrillation: Secondary | ICD-10-CM | POA: Diagnosis not present

## 2021-02-28 NOTE — ED Provider Notes (Signed)
HPI  SUBJECTIVE:  Kayla Zamora is a 75 y.o. female who presents with worsening shortness of breath, dyspnea on exertion to about 10 feet, dizziness, new lower extremity edema.  She was seen here 3 days ago, COVID, flu were negative, chest x-ray negative for pneumonia, she was thought to have acute COPD exacerbation and was sent home on prednisone and doxycycline.  Patient states that she has been taking these medications as directed.  States that she measured her heart rate at home and it was 145, blood pressure 120/100.  She denies chest pain, pressure, heaviness.  No palpitations.  No nausea, vomiting, abdominal pain, syncope, although she becomes diaphoretic with exertion.  She lives by herself, and was driven here by a relative.  She has tried lying down flat with improvement in her symptoms, symptoms are worse with exertion, standing.  She has a past medical history of SVT, COVID.  No formal diagnosis of COPD per patient.  No history of atrial fibrillation, MI, coronary artery disease, diabetes, hypertension, smoking.  PMD: [facility] internal medicine.  Past Medical History:  Diagnosis Date  . COVID-19 10/21/2020  . Depression   . Dysplastic nevus 12/21/2019   L lat waistline - moderate  . Dysplastic nevus 12/21/2019   L ant waistline near groin - moderate  . Hypothyroidism     Past Surgical History:  Procedure Laterality Date  . CESAREAN SECTION    . THYROIDECTOMY, PARTIAL      Family History  Problem Relation Age of Onset  . Stroke Mother   . Dementia Mother   . Lung cancer Father     Social History   Tobacco Use  . Smoking status: Never Smoker  . Smokeless tobacco: Never Used  Vaping Use  . Vaping Use: Never used  Substance Use Topics  . Alcohol use: Yes    Alcohol/week: 2.0 standard drinks    Types: 2 Cans of beer per week  . Drug use: No    No current facility-administered medications for this encounter.  Current Outpatient Medications:  .  benzonatate (TESSALON)  200 MG capsule, Take 1 capsule (200 mg total) by mouth 3 (three) times daily as needed for cough., Disp: 30 capsule, Rfl: 0 .  citalopram (CELEXA) 10 MG tablet, Take 15 mg by mouth daily., Disp: , Rfl: 3 .  doxycycline (VIBRAMYCIN) 100 MG capsule, Take 1 capsule (100 mg total) by mouth 2 (two) times daily., Disp: 14 capsule, Rfl: 0 .  predniSONE (DELTASONE) 50 MG tablet, 1 tablet daily x 5 days, Disp: 5 tablet, Rfl: 0  Allergies  Allergen Reactions  . Levofloxacin Other (See Comments)    Jittery  . Penicillins Swelling and Rash    Has patient had a PCN reaction causing immediate rash, facial/tongue/throat swelling, SOB or lightheadedness with hypotension: No Has patient had a PCN reaction causing severe rash involving mucus membranes or skin necrosis: No Has patient had a PCN reaction that required hospitalization No Has patient had a PCN reaction occurring within the last 10 years: No If all of the above answers are "NO", then may proceed with Cephalosporin use.   . Sulfa Antibiotics Rash     ROS  As noted in HPI.   Physical Exam  BP 100/74 (BP Location: Right Arm)   Pulse (!) 130   Temp 98.3 F (36.8 C) (Oral)   Resp 20   Ht 5\' 4"  (1.626 m)   Wt 77.6 kg   SpO2 96%   BMI 29.37 kg/m  Constitutional: Well developed, well nourished, no acute distress Eyes:  EOMI, conjunctiva normal bilaterally HENT: Normocephalic, atraumatic,mucus membranes moist Respiratory: Normal inspiratory effort, rales at bases bilaterally. Cardiovascular: Irregularly irregular, tachycardic, no murmurs rubs or gallops GI: nondistended skin: No rash, skin intact Musculoskeletal: Trace edema bilateral lower extremities. Neurologic: Alert & oriented x 3, no focal neuro deficits Psychiatric: Speech and behavior appropriate   ED Course   Medications - No data to display  Orders Placed This Encounter  Procedures  . ED EKG    Standing Status:   Standing    Number of Occurrences:   1    Order  Specific Question:   Reason for Exam    Answer:   Other (See Comments)  . EKG 12-Lead    Standing Status:   Standing    Number of Occurrences:   1    No results found for this or any previous visit (from the past 24 hour(s)). No results found.  ED Clinical Impression  1. Atrial fibrillation, unspecified type ([place])   2. Shortness of breath      ED Assessment/Plan  EKG: Atrial fibrillation with RVR, rate 126.  Normal axis.  No hypertrophy.  No ST elevation.  This is new compared to EKG from 2018.  Patient is in new onset symptomatic atrial fibrillation, could be from the prednisone, COPD exacerbation, or another cause.  Advised patient to go to the [facility] via EMS, she prefers to go to [facility] as her medical care is there and would like to go via private vehicle.  Sister-in-law will take her.  Feel that she is stable enough to do so.  She agrees to go there immediately.  Discussed MDM, rationale for transfer to the emergency department with the patient.  She agrees with plan.  No orders of the defined types were placed in this encounter.     *This clinic note was created using Dragon dictation software. Therefore, there may be occasional mistakes despite careful proofreading.    Melynda Ripple, MD 02/28/21 [phone]

## 2021-02-28 NOTE — Discharge Instructions (Addendum)
Please go directly to the [facility] emergency department.  You are in new onset atrial fibrillation and are quite symptomatic from it.  You can have a stroke, congestive heart failure, or heart attack if this is not managed appropriately.  Let them know if you start having chest pressure, heaviness, if you start to get worse, or for any other concerns.

## 2021-02-28 NOTE — ED Notes (Signed)
Patient is being discharged from the Urgent Care and sent to the Emergency Department via POV . Per Dr. Alphonzo Cruise, patient is in need of higher level of care due to new onset Afib. Patient is aware and verbalizes understanding of plan of care.  Vitals:   02/28/21 1537  BP: 100/74  Pulse: (!) 130  Resp: 20  Temp: 98.3 F (36.8 C)  SpO2: 96%

## 2021-02-28 NOTE — ED Triage Notes (Signed)
Pt was seen on Monday for COPD exacerbation. She was given prednisone and doxy. She states she has increased shortness of breath and tachycardia (140).

## 2021-03-01 DIAGNOSIS — E785 Hyperlipidemia, unspecified: Secondary | ICD-10-CM | POA: Insufficient documentation

## 2021-05-06 ENCOUNTER — Encounter: Payer: Self-pay | Admitting: Dermatology

## 2021-05-06 ENCOUNTER — Ambulatory Visit: Payer: Medicare PPO | Admitting: Dermatology

## 2021-05-06 ENCOUNTER — Other Ambulatory Visit: Payer: Self-pay

## 2021-05-06 DIAGNOSIS — L820 Inflamed seborrheic keratosis: Secondary | ICD-10-CM | POA: Diagnosis not present

## 2021-05-06 DIAGNOSIS — D1800 Hemangioma unspecified site: Secondary | ICD-10-CM

## 2021-05-06 DIAGNOSIS — Z86018 Personal history of other benign neoplasm: Secondary | ICD-10-CM

## 2021-05-06 DIAGNOSIS — D229 Melanocytic nevi, unspecified: Secondary | ICD-10-CM

## 2021-05-06 DIAGNOSIS — Z1283 Encounter for screening for malignant neoplasm of skin: Secondary | ICD-10-CM

## 2021-05-06 DIAGNOSIS — L821 Other seborrheic keratosis: Secondary | ICD-10-CM

## 2021-05-06 DIAGNOSIS — I8393 Asymptomatic varicose veins of bilateral lower extremities: Secondary | ICD-10-CM | POA: Diagnosis not present

## 2021-05-06 DIAGNOSIS — L578 Other skin changes due to chronic exposure to nonionizing radiation: Secondary | ICD-10-CM

## 2021-05-06 DIAGNOSIS — L814 Other melanin hyperpigmentation: Secondary | ICD-10-CM

## 2021-05-06 NOTE — Progress Notes (Signed)
Follow-Up Visit   Subjective  Kayla Zamora is a 75 y.o. female who presents for the following: Annual Exam (History of dysplastic nevi - TBSE today). The patient presents for Total-Body Skin Exam (TBSE) for skin cancer screening and mole check.  The following portions of the chart were reviewed this encounter and updated as appropriate:   Tobacco  Allergies  Meds  Problems  Med Hx  Surg Hx  Fam Hx     Review of Systems:  No other skin or systemic complaints except as noted in HPI or Assessment and Plan.  Objective  Well appearing patient in no apparent distress; mood and affect are within normal limits.  A full examination was performed including scalp, head, eyes, ears, nose, lips, neck, chest, axillae, abdomen, back, buttocks, bilateral upper extremities, bilateral lower extremities, hands, feet, fingers, toes, fingernails, and toenails. All findings within normal limits unless otherwise noted below.  Right scalp x 1, right neck x 1, right post thigh x 1, left medial bicep x 1 - total x 4 (4) Erythematous keratotic or waxy stuck-on papule or plaque.   Legs Spider veins   Assessment & Plan   History of Dysplastic Nevi - No evidence of recurrence today - Recommend regular full body skin exams - Recommend daily broad spectrum sunscreen SPF 30+ to sun-exposed areas, reapply every 2 hours as needed.  - Call if any new or changing lesions are noted between office visits  Lentigines - Scattered tan macules - Due to sun exposure - Benign-appering, observe - Recommend daily broad spectrum sunscreen SPF 30+ to sun-exposed areas, reapply every 2 hours as needed. - Call for any changes  Seborrheic Keratoses - Stuck-on, waxy, tan-brown papules and/or plaques  - Benign-appearing - Discussed benign etiology and prognosis. - Observe - Call for any changes - Discussed LN2 treatments followed by chemical peels. Advised patient fee for each treatment is $350. Advised patient  she may need 2 LN2 treatments and 2 chemical peels to treat arms.  Melanocytic Nevi - Tan-brown and/or pink-flesh-colored symmetric macules and papules - Benign appearing on exam today - Observation - Call clinic for new or changing moles - Recommend daily use of broad spectrum spf 30+ sunscreen to sun-exposed areas.   Hemangiomas - Red papules - Discussed benign nature - Observe - Call for any changes  Actinic Damage - Chronic condition, secondary to cumulative UV/sun exposure - diffuse scaly erythematous macules with underlying dyspigmentation - Recommend daily broad spectrum sunscreen SPF 30+ to sun-exposed areas, reapply every 2 hours as needed.  - Staying in the shade or wearing long sleeves, sun glasses (UVA+UVB protection) and wide brim hats (4-inch brim around the entire circumference of the hat) are also recommended for sun protection.  - Call for new or changing lesions.  Skin cancer screening performed today.  Inflamed seborrheic keratosis Right scalp x 1, right neck x 1, right post thigh x 1, left medial bicep x 1 - total x 4  Destruction of lesion - Right scalp x 1, right neck x 1, right post thigh x 1, left medial bicep x 1 - total x 4 Complexity: simple   Destruction method: cryotherapy   Informed consent: discussed and consent obtained   Timeout:  patient name, date of birth, surgical site, and procedure verified Lesion destroyed using liquid nitrogen: Yes   Region frozen until ice ball extended beyond lesion: Yes   Outcome: patient tolerated procedure well with no complications   Post-procedure details: wound care instructions given  Spider veins of both lower extremities Legs  Some improvement after sclerotherapy.  Skin cancer screening  Return in about 1 year (around 05/06/2022).  I, Ashok Cordia, CMA, am acting as scribe for Sarina Ser, MD . Documentation: I have reviewed the above documentation for accuracy and completeness, and I agree with the  above.  Sarina Ser, MD

## 2021-05-06 NOTE — Patient Instructions (Signed)

## 2021-06-02 NOTE — Discharge Instructions (Signed)
Instructions after Total Knee Replacement   Avital Dancy P. Jariyah Hackley, Jr., M.D.     Dept. of Orthopaedics & Sports Medicine  [facility]  [address]  [place], [place]  [address]  Phone: [phone]   Fax: [phone]    DIET: Drink plenty of non-alcoholic fluids. Resume your normal diet. Include foods high in fiber.  ACTIVITY:  You may use crutches or a walker with weight-bearing as tolerated, unless instructed otherwise. You may be weaned off of the walker or crutches by your Physical Therapist.  Do NOT place pillows under the knee. Anything placed under the knee could limit your ability to straighten the knee.   Continue doing gentle exercises. Exercising will reduce the pain and swelling, increase motion, and prevent muscle weakness.   Please continue to use the TED compression stockings for 6 weeks. You may remove the stockings at night, but should reapply them in the morning. Do not drive or operate any equipment until instructed.  WOUND CARE:  Continue to use the PolarCare or ice packs periodically to reduce pain and swelling. You may bathe or shower after the staples are removed at the first office visit following surgery.  MEDICATIONS: You may resume your regular medications. Please take the pain medication as prescribed on the medication. Do not take pain medication on an empty stomach. You have been given a prescription for a blood thinner (Lovenox or Coumadin). Please take the medication as instructed. (NOTE: After completing a 2 week course of Lovenox, take one Enteric-coated aspirin once a day. This along with elevation will help reduce the possibility of phlebitis in your operated leg.) Do not drive or drink alcoholic beverages when taking pain medications.  CALL THE OFFICE FOR: Temperature above 101 degrees Excessive bleeding or drainage on the dressing. Excessive swelling, coldness, or paleness of the toes. Persistent nausea and vomiting.  FOLLOW-UP:  You  should have an appointment to return to the office in 10-14 days after surgery. Arrangements have been made for continuation of Physical Therapy (either home therapy or outpatient therapy).   [facility] Department Directory         [email or web address]       [email or web address]          Cardiology  Appointments: [place] - [phone] [place] - [phone]  Endocrinology  Appointments: [place] - [phone] [place] - [phone]  Gastroenterology  Appointments: [employer] - [phone] [place] - [phone]        General Surgery   Appointments: [place] - [phone]  Internal Medicine/Family Medicine  Appointments: [place] - [phone] [place] - [phone] [place] - [phone]  Metabolic and Weigh Loss Surgery  Appointments: [place] - [phone]        Neurology  Appointments: [place] - [phone] [place] - [phone]  Neurosurgery  Appointments: [place] - [phone]  Obstetrics & Gynecology  Appointments: [place] - [phone] [place] - [phone]        Pediatrics  Appointments: [place] - [phone] [place] - [phone]  Physiatry  Appointments: [place] -[phone]  Physical Therapy  Appointments: [place] - [phone] [place] - [phone]        Podiatry  Appointments: [place] - [phone] [place] - [phone]  Pulmonology  Appointments: [place] - [phone]  Rheumatology  Appointments: [place] - [phone]        [place] Location: [facility]  [address] , [place]  [address]  [place] Location: [facility] [address] [place], [place]  [address]  [place] Location: [facility] [address] [place], [place]  [address]    

## 2021-06-18 ENCOUNTER — Encounter
Admission: RE | Admit: 2021-06-18 | Discharge: 2021-06-18 | Disposition: A | Discharge status: Home / Self Care | Payer: Medicare PPO | Source: Ambulatory Visit | Attending: Orthopedic Surgery | Admitting: Orthopedic Surgery

## 2021-06-18 ENCOUNTER — Other Ambulatory Visit: Payer: Self-pay

## 2021-06-18 DIAGNOSIS — Z01818 Encounter for other preprocedural examination: Secondary | ICD-10-CM | POA: Diagnosis not present

## 2021-06-18 DIAGNOSIS — I4891 Unspecified atrial fibrillation: Secondary | ICD-10-CM | POA: Insufficient documentation

## 2021-06-18 DIAGNOSIS — Z01810 Encounter for preprocedural cardiovascular examination: Secondary | ICD-10-CM | POA: Diagnosis not present

## 2021-06-18 HISTORY — DX: Cardiac arrhythmia, unspecified: I49.9

## 2021-06-18 HISTORY — DX: Unspecified osteoarthritis, unspecified site: M19.90

## 2021-06-18 LAB — URINALYSIS, ROUTINE W REFLEX MICROSCOPIC
Bilirubin Urine: NEGATIVE
Glucose, UA: NEGATIVE mg/dL
Hgb urine dipstick: NEGATIVE
Ketones, ur: NEGATIVE mg/dL
Leukocytes,Ua: NEGATIVE
Nitrite: NEGATIVE
Protein, ur: NEGATIVE mg/dL
Specific Gravity, Urine: 1.025 (ref 1.005–1.030)
pH: 5 (ref 5.0–8.0)

## 2021-06-18 LAB — TYPE AND SCREEN
ABO/RH(D): O POS
Antibody Screen: NEGATIVE

## 2021-06-18 LAB — COMPREHENSIVE METABOLIC PANEL
ALT: 14 U/L (ref 0–44)
AST: 21 U/L (ref 15–41)
Albumin: 3.9 g/dL (ref 3.5–5.0)
Alkaline Phosphatase: 82 U/L (ref 38–126)
Anion gap: 7 (ref 5–15)
BUN: 16 mg/dL (ref 8–23)
CO2: 29 mmol/L (ref 22–32)
Calcium: 8.8 mg/dL — ABNORMAL LOW (ref 8.9–10.3)
Chloride: 104 mmol/L (ref 98–111)
Creatinine, Ser: 0.78 mg/dL (ref 0.44–1.00)
GFR, Estimated: >60 mL/min (ref >60)
Glucose, Bld: 90 mg/dL (ref 70–99)
Potassium: 3.9 mmol/L (ref 3.5–5.1)
Sodium: 140 mmol/L (ref 135–145)
Total Bilirubin: 0.5 mg/dL (ref 0.3–1.2)
Total Protein: 6.8 g/dL (ref 6.5–8.1)

## 2021-06-18 LAB — CBC
HCT: 39.3 % (ref 36.0–46.0)
Hemoglobin: 13 g/dL (ref 12.0–15.0)
MCH: 31.6 pg (ref 26.0–34.0)
MCHC: 33.1 g/dL (ref 30.0–36.0)
MCV: 95.4 fL (ref 80.0–100.0)
Platelets: 192 10*3/uL (ref 150–400)
RBC: 4.12 MIL/uL (ref 3.87–5.11)
RDW: 12.9 % (ref 11.5–15.5)
WBC: 6.1 10*3/uL (ref 4.0–10.5)
nRBC: 0 % (ref 0.0–0.2)

## 2021-06-18 LAB — APTT: aPTT: 33 seconds (ref 24–36)

## 2021-06-18 LAB — SURGICAL PCR SCREEN
MRSA, PCR: NEGATIVE
Staphylococcus aureus: NEGATIVE

## 2021-06-18 LAB — SEDIMENTATION RATE: Sed Rate: 16 mm/hr (ref 0–30)

## 2021-06-18 LAB — PROTIME-INR
INR: 1.3 — ABNORMAL HIGH (ref 0.8–1.2)
Prothrombin Time: 16.1 seconds — ABNORMAL HIGH (ref 11.4–15.2)

## 2021-06-18 LAB — C-REACTIVE PROTEIN: CRP: 0.6 mg/dL (ref <1.0)

## 2021-06-18 NOTE — Pre-Procedure Instructions (Signed)
Order from Dr. Marry Guan to hold Eliquis 3 days before surgery.   Last dose on 9/12.  Patient states Cardiologist approved this.

## 2021-06-18 NOTE — Patient Instructions (Signed)
Your procedure is scheduled on: Friday 9/16 Report to Day Surgery.  [facility] Registration Desk To find out your arrival time please call [phone] between 1PM - 3PM on Thurs. 9/15.  Remember: Instructions that are not followed completely may result in serious medical risk,  up to and including death, or upon the discretion of your surgeon and anesthesiologist your  surgery may need to be rescheduled.     _X__ 1. Do not eat food after midnight the night before your procedure.                 No chewing gum or hard candies. You may drink clear liquids up to 2 hours                 before you are scheduled to arrive for your surgery- DO not drink clear                 liquids within 2 hours of the start of your surgery.                 Clear Liquids include:  water, apple juice without pulp, clear Gatorade, G2 or                  Gatorade Zero (avoid Red/Purple/Blue), Black Coffee or Tea (Do not add                 anything to coffee or tea). ___x__2.   Complete the "Ensure Clear Pre-surgery Clear Carbohydrate Drink provided to you, 2 hours before arrival.   __X__2.  On the morning of surgery brush your teeth with toothpaste and water, you                may rinse your mouth with mouthwash if you wish.  Do not swallow any   toothpaste of mouthwash.     _X__ 3.  No Alcohol for 24 hours before or after surgery.   ___ 4.  Do Not Smoke or use e-cigarettes For 24 Hours Prior to Your Surgery.                 Do not use any chewable tobacco products for at least 6 hours prior to                 Surgery.  ___  5.  Do not use any recreational drugs (marijuana, cocaine, heroin, ecstasy,       MDMA or other) For at least one week prior to your surgery.            Combination of these drugs with anesthesia may have life threatening       results.  ____  6.  Bring all medications with you on the day of surgery if instructed.   __x__  7.  Notify your doctor if there is  any change in your medical condition      (cold, fever, infections).     Do not wear jewelry, make-up, hairpins, clips or nail polish. Do not wear lotions, powders, or perfumes. You may wear deodorant. Do not shave 48 hours prior to surgery.  Do not bring valuables to the hospital.    [facility] is not responsible for any belongings or valuables.  Contacts, dentures or bridgework may not be worn into surgery. Leave your suitcase in the car. After surgery it may be brought to your room. For patients admitted to the hospital, discharge time is determined by your treatment team.  Patients discharged the day of surgery will not be allowed to drive home.   Make arrangements for someone to be with you for the first 24 hours of your Same Day Discharge.    Please read over the following fact sheets that you were given:   Incentive spirometer    __x__ Take these medicines the morning of surgery with A SIP OF WATER:    1. citalopram (CELEXA) 10 MG tablet  2.   3.   4.  5.  6.  ____ Fleet Enema (as directed)   __x__ Use CHG Soap (or wipes) as directed  ____ Use Benzoyl Peroxide Gel as instructed  ____ Use inhalers on the day of surgery  ____ Stop metformin 2 days prior to surgery    ____ Take 1/2 of usual insulin dose the night before surgery. No insulin the morning          of surgery.   ___x_ Stop Eliquis   last dose on 9/12  __x__ Stop Anti-inflammatories until after surgery   __x__ Stop supplements until after surgery.  Melatonin and biotin  on Friday 9/9  ____ Bring C-Pap to the hospital.   *Bring a copy of living will  If you have any questions regarding your pre-procedure instructions,  Please call Pre-admit Testing at [phone]

## 2021-06-19 LAB — URINE CULTURE

## 2021-06-22 LAB — IGE: IgE (Immunoglobulin E), Serum: 48 IU/mL (ref 6–495)

## 2021-06-23 NOTE — H&P (Signed)
ORTHOPAEDIC HISTORY & PHYSICAL Kayla Zamora, [place] - 06/18/2021 10:45 AM EDT Formatting of this note is different from the original. [place] [facility] Chief Complaint:   Chief Complaint  Patient presents with   Knee Pain  H & P RIGHT KNEE   History of Present Illness:   Kayla Zamora is a 75 y.o. female that presents to clinic today for her preoperative history and evaluation. Patient presents unaccompanied. The patient is scheduled to undergo a right total knee arthroplasty on 06/28/21 by Dr. Marry Guan. Her pain began several years ago. The pain is located primarily along the medial aspect of the knee. She describes her pain as worse with weightbearing. She reports associated swelling with some giving way of the knee. She denies associated numbness or tingling, denies locking.   The patient's symptoms have progressed to the point that they decrease her quality of life. The patient has previously undergone conservative treatment including NSAIDS and injections to the knee without adequate control of her symptoms.  Patient denies history of lumbar surgery, blood clots. She does see a cardiologist for A-fib which is treated with metoprolol and Eliquis.  She has her daughter who lives next door to help the first week after surgery.  She does report a penicillin allergy as a child which caused hives.  Past Medical, Surgical, Family, Social History, Allergies, Medications:   Past Medical History:  Past Medical History:  Diagnosis Date   History of hypothyroidism   Hyperthyroidism, unspecified  mild   Toxic multinodular goiter   Past Surgical History:  Past Surgical History:  Procedure Laterality Date   Biopsy of thyroid nodules  Right-sided 2.37 cm nodule on 10/23/11 benign; biopsy of right-sided 1.94 cm nodule 08/27/12 benign   CESAREAN SECTION 1981   Left hemithyroidectomy 1996   Current Medications:  Current Outpatient Medications   Medication Sig Dispense Refill   acetaminophen (TYLENOL) 500 MG tablet Take 1,000 mg by mouth every 8 (eight) hours.   acetaminophen (TYLENOL) 650 MG ER tablet Take 650 mg by mouth every 8 (eight) hours as needed for Pain. Reported on 10/12/2015   citalopram (CELEXA) 10 MG tablet Take 5 mg by mouth once daily   ELIQUIS 5 mg tablet Take 5 mg by mouth every 12 (twelve) hours   metoprolol succinate (TOPROL-XL) 25 MG XL tablet Take 25 mg by mouth once daily   simvastatin (ZOCOR) 20 MG tablet Take 20 mg by mouth nightly   No current facility-administered medications for this visit.   Allergies:  Allergies  Allergen Reactions   Atorvastatin Muscle Pain   Levaquin [Levofloxacin] Other (See Comments)  Jittery   Penicillin V Potassium Hives   Sulfa (Sulfonamide Antibiotics) Rash   Penicillins Rash and Swelling  Has patient had a PCN reaction causing immediate rash, facial/tongue/throat swelling, SOB or lightheadedness with hypotension: No Has patient had a PCN reaction causing severe rash involving mucus membranes or skin necrosis: No Has patient had a PCN reaction that required hospitalization No Has patient had a PCN reaction occurring within the last 10 years: No If all of the above answers are "NO", then may proceed with Cephalosporin use.   Social History:  Social History   Socioeconomic History   Marital status: Widowed  Tobacco Use   Smoking status: Never Smoker   Smokeless tobacco: Never Used   Tobacco comment: Does not smoke cigarettes.  Vaping Use   Vaping Use: Never used  Substance and Sexual Activity   Alcohol  use: Not Currently  Alcohol/week: 0.0 standard drinks  Comment: Drinks about 2 alcoholic beverages per week.   Drug use: No   Sexual activity: Defer   Family History:  Family History  Problem Relation Age of Onset   Lung cancer Father   Kidney cancer Brother   Colon polyps Brother  adenomatous, post resection   Thyroid disease Neg Hx   Colon cancer Neg  Hx   Review of Systems:   A 10+ ROS was performed, reviewed, and the pertinent orthopaedic findings are documented in the HPI.   Physical Examination:   BP 130/78 (BP Location: Left upper arm, Patient Position: Sitting, BP Cuff Size: Adult)  Ht 162.6 cm ('5\' 4"'$ )  Wt 80.7 kg (178 lb)  LMP (LMP Unknown)  BMI 30.55 kg/m   Patient is a well-developed, well-nourished female in no acute distress. Patient has normal mood and affect. Patient is alert and oriented to person, place, and time.   HEENT: Atraumatic, normocephalic. Pupils equal and reactive to light. Extraocular motion intact. Noninjected sclera.  Cardiovascular: Regular rate and rhythm, with no murmurs, rubs, or gallops. Distal pulses palpable. No bruits.   Respiratory: Lungs clear to auscultation bilaterally.   Right Knee: Soft tissue swelling: mild Effusion: minimal Erythema: none Crepitance: mild Tenderness: medial Alignment: relative varus Mediolateral laxity: medial pseudolaxity Posterior sag: negative Patellar tracking: Good tracking without evidence of subluxation or tilt Atrophy: No significant atrophy.  Quadriceps tone was fair to good. Range of motion: 0/7/129 degrees   Sensation intact to ligh touch over the saphenous, lateral sural cutaneous, superficial fibular, and deep fibular nerve distributions.  Tests Performed/Reviewed:  X-rays  3 views of the right knee were obtained. Images reveal severe loss of medial compartment joint space with significant osteophyte formation. No fractures or dislocations. No osseous abnormality noted.  I personally ordered and interpreted today's x-rays.  Impression:   ICD-10-CM  1. Primary osteoarthritis of right knee M17.11   Plan:   The patient has end-stage degenerative changes of the right knee. It was explained to the patient that the condition is progressive in nature. Having failed conservative treatment, the patient has elected to proceed with a total joint  arthroplasty. The patient will undergo a total joint arthroplasty with Dr. Marry Guan. The risks of surgery, including blood clot and infection, were discussed with the patient. Measures to reduce these risks, including the use of anticoagulation, perioperative antibiotics, and early ambulation were discussed. The importance of postoperative physical therapy was discussed with the patient. The patient elects to proceed with surgery. The patient is instructed to stop all blood thinners prior to surgery. The patient is instructed to call the hospital the day before surgery to learn of the proper arrival time.   Contact our office with any questions or concerns. Follow up as indicated, or sooner should any new problems arise, if conditions worsen, or if they are otherwise concerned.   Kayla Zamora, [facility] [place], [place] [address] Phone: [phone]  This note was generated in part with voice recognition software and I apologize for any typographical errors that were not detected and corrected.  Electronically signed by Kayla Fudge, PA at 06/18/2021 6:19 PM EDT

## 2021-06-26 ENCOUNTER — Other Ambulatory Visit
Admission: RE | Admit: 2021-06-26 | Discharge: 2021-06-26 | Disposition: A | Discharge status: Home / Self Care | Payer: Medicare PPO | Source: Ambulatory Visit | Attending: Orthopedic Surgery | Admitting: Orthopedic Surgery

## 2021-06-26 ENCOUNTER — Encounter: Payer: Self-pay | Admitting: Orthopedic Surgery

## 2021-06-26 ENCOUNTER — Other Ambulatory Visit: Payer: Self-pay

## 2021-06-26 DIAGNOSIS — Z01812 Encounter for preprocedural laboratory examination: Secondary | ICD-10-CM | POA: Insufficient documentation

## 2021-06-26 DIAGNOSIS — Z20822 Contact with and (suspected) exposure to covid-19: Secondary | ICD-10-CM | POA: Diagnosis not present

## 2021-06-26 LAB — SARS CORONAVIRUS 2 (TAT 6-24 HRS): SARS Coronavirus 2: NEGATIVE

## 2021-06-26 NOTE — Progress Notes (Signed)
Perioperative Services  Pre-Admission/Anesthesia Testing Clinical Review  Date: 06/26/21  Patient Demographics:  Name: Kayla Zamora DOB:   [birth year] MRN:   [id number]  Planned Surgical Procedure(s):    Case: [id number] Date/Time: 06/28/21 1125   Procedure: COMPUTER ASSISTED TOTAL KNEE ARTHROPLASTY - RNFA (Right: Knee)   Anesthesia type: Choice   Pre-op diagnosis: PRIMARY OSTEOARTHRITIS OF RIGHT KNEE.   Location: [facility] OR ROOM 03 / [place] [facility]   Surgeons: Dereck Leep, MD     NOTE: Available PAT nursing documentation and vital signs have been reviewed. Clinical nursing staff has updated patient's PMH/PSHx, current medication list, and drug allergies/intolerances to ensure comprehensive history available to assist in medical decision making as it pertains to the aforementioned surgical procedure and anticipated anesthetic course. Extensive review of available clinical information performed. [place] PMH and PSHx updated with any diagnoses/procedures that  may have been inadvertently omitted during her intake with the pre-admission testing department's nursing staff.  Clinical Discussion:  Kayla Zamora is a 75 y.o. female who is submitted for pre-surgical anesthesia review and clearance prior to her undergoing the above procedure. Patient has never been a smoker. Pertinent PMH includes: atrial fibrillation, SVT, HLD, hypothyroidism, COPD, OA, depression, insomnia.  Patient is followed by cardiology Charletta Cousin, MD). She was last seen in the cardiology clinic on 03/29/2021; notes reviewed. At the time of her clinic visit, the patient denied any chest pain, shortness of breath, PND, orthopnea, palpitations, significant peripheral edema, vertiginous symptoms, or presyncope/syncope.  Patient did complain of fatigue and minor weight gain.  Patient with a PMH significant for cardiovascular diagnoses.  Exercise stress test performed on 03/03/2017 revealed nonspecific inferior ST  segment changes during early recovery.  Overall exercise capacity fair.  No significant angina/anginal equivalent symptoms reported during the study.  Estimated workload 7.0 METS.  Duke treadmill score 4; angina score 0.  Negative for ischemia at workload achieved.  Myocardial perfusion imaging study performed on 06/14/2019 revealed an LVEF of 65%.  There were no significant coronary calcifications noted on attenuation CT.  There was no evidence of significant stress-induced myocardial ischemia, arrhythmia, or scar.  Study determined to be normal and low risk.  TTE performed on 03/01/2021 revealed a normal left ventricular systolic function with visually estimated EF of 60 to 65%.  There was mild mitral annular calcification and mild LEFT atrial dilatation noted.  There was no evidence of significant valvular regurgitation or stenosis.  PMH significant for PAF.  Patiently recently admitted to [facility] where she was found to be in A. fib with RVR.  CHA2DS2-VASc Score = 2 (age, sex).  Patient chronically anticoagulated using daily apixaban; compliant with therapy with no evidence of GI bleeding.  Rate and rhythm maintained on beta-blocker monotherapy.  Blood pressure well controlled at 125/58 on daily metoprolol.  Patient is on a statin for HLD.  She is not diabetic. Functional capacity, as defined by DASI, is documented as being >/= 4 METS.  Given patient's complaints of fatigue upon awakening, the decision was made to reduce her beta-blocker dose to 12.5 mg daily.  No other changes were made to her medication regimen.  Patient to follow-up with outpatient cardiology in 5 to 6 months or sooner if needed.  Kayla Zamora is scheduled for an elective RIGHT TOTAL KNEE ARTHROPLASTY  on 06/28/2021 with Dr. Skip Estimable, MD.  Given patient's past medical history significant for cardiovascular diagnoses, presurgical cardiac clearance was sought by the PAT team. Per cardiology, "this patient is optimized  for surgery and  may proceed with the planned procedural course with an ACCEPTABLE risk of significant perioperative cardiovascular complications".  Again, this patient is on daily anticoagulation therapy.  She is been instructed on recommendations for holding her apixaban for 3 days prior to her procedure with plans to restart as soon as postoperative bleeding was found to be minimized by her primary attending surgeon.  The patient is aware that her last dose of apixaban will be on 06/24/2021.  Patient denies previous perioperative complications with anesthesia in the past. In review of the available records, it is noted that patient underwent a general anesthetic course at [facility] ([facility] in 11/2020 without documented complications.   Vitals with BMI 06/18/2021 02/28/2021 02/25/2021  Height '5\' 4"'$  '5\' 4"'$  '5\' 4"'$   Weight 178 lbs 5 oz 171 lbs 1 oz 171 lbs  BMI 30.59 [id number]  Systolic [id number] [id number]  Diastolic 70 74 63  Pulse 80 130 91    Providers/Specialists:   NOTE: Primary physician provider listed below. Patient may have been seen by APP or partner within same practice.   PROVIDER ROLE / SPECIALTY LAST OV  Hooten, Laurice Record, MD Orthopedics (Surgeon) 06/18/2021  Chelsea Primus, MD Primary Care Provider 06/05/2021  Kandis Cocking, MD Cardiology 03/29/2021   Allergies:  Levofloxacin, Penicillins, and Sulfa antibiotics  Current Home Medications:   No current facility-administered medications for this encounter.    apixaban (ELIQUIS) 5 MG TABS tablet   citalopram (CELEXA) 10 MG tablet   diclofenac Sodium (VOLTAREN) 1 % GEL   metoprolol succinate (TOPROL-XL) 25 MG 24 hr tablet   simvastatin (ZOCOR) 20 MG tablet   acetaminophen (TYLENOL) 650 MG CR tablet   benzonatate (TESSALON) 200 MG capsule   Biotin w/ Vitamins C & E 1250-7.5-7.5 MCG-MG-UNT CHEW   Melatonin 2.5 MG CHEW   History:   Past Medical History:  Diagnosis Date   Arthritis    Atrial fibrillation (HCC)    Cataracts, bilateral     Chronic anticoagulation    Apixaban   COPD (chronic obstructive pulmonary disease) (HCC)    Depression    Dyshidrotic eczema    Dysplastic nevus 12/21/2019   a.) L lat waistline - moderate. b.) L ant waistline near groin - moderate   History of 2019 novel coronavirus disease (COVID-19) 10/21/2020   HLD (hyperlipidemia)    Hypothyroidism    Multiple thyroid nodules    Pneumonia 2017   Sleep difficulties    Takes melatonin   SVT (supraventricular tachycardia) (HCC)    Past Surgical History:  Procedure Laterality Date   CESAREAN SECTION     EYE SURGERY Bilateral 2014   THYROIDECTOMY, PARTIAL     Family History  Problem Relation Age of Onset   Stroke Mother    Dementia Mother    Lung cancer Father    Social History   Tobacco Use   Smoking status: Never   Smokeless tobacco: Never  Vaping Use   Vaping Use: Never used  Substance Use Topics   Alcohol use: Yes    Alcohol/week: 2.0 standard drinks    Types: 2 Cans of beer per week   Drug use: No    Pertinent Clinical Results:  LABS: Labs reviewed: Acceptable for surgery.  No visits with results within 3 Day(s) from this visit.  Latest known visit with results is:  Hospital Outpatient Visit on 06/18/2021  Component Date Value Ref Range Status   CRP 06/18/2021 0.6  <1.0 mg/dL Final  Performed at [facility], [place] [address], [place], [place] [address]   IgE (Immunoglobulin E), Serum 06/18/2021 48  6 - 495 IU/mL Final   Comment: (NOTE) Performed At: [facility] [place], [place] [id number] Rush Farmer MD [id number]    Sed Rate 06/18/2021 16  0 - 30 mm/hr Final   Performed at [facility], [address] [place], [place] [address]   MRSA, PCR 06/18/2021 NEGATIVE  NEGATIVE Final   Staphylococcus aureus 06/18/2021 NEGATIVE  NEGATIVE Final   Comment: (NOTE) The Xpert SA Assay (FDA approved for NASAL specimens in patients 56 years of age and older), is one component of a  comprehensive surveillance program. It is not intended to diagnose infection nor to guide or monitor treatment. Performed at [facility], [address] [place], [place] [address]    WBC 06/18/2021 6.1  4.0 - 10.5 K/uL Final   RBC 06/18/2021 4.12  3.87 - 5.11 MIL/uL Final   Hemoglobin 06/18/2021 13.0  12.0 - 15.0 g/dL Final   HCT 06/18/2021 39.3  36.0 - 46.0 % Final   MCV 06/18/2021 95.4  80.0 - 100.0 fL Final   MCH 06/18/2021 31.6  26.0 - 34.0 pg Final   MCHC 06/18/2021 33.1  30.0 - 36.0 g/dL Final   RDW 06/18/2021 12.9  11.5 - 15.5 % Final   Platelets 06/18/2021 192  150 - 400 K/uL Final   nRBC 06/18/2021 0.0  0.0 - 0.2 % Final   Performed at [facility], [place], [place] [address]   Sodium 06/18/2021 140  135 - 145 mmol/L Final   Potassium 06/18/2021 3.9  3.5 - 5.1 mmol/L Final   Chloride 06/18/2021 104  98 - 111 mmol/L Final   CO2 06/18/2021 29  22 - 32 mmol/L Final   Glucose, Bld 06/18/2021 90  70 - 99 mg/dL Final   Glucose reference range applies only to samples taken after fasting for at least 8 hours.   BUN 06/18/2021 16  8 - 23 mg/dL Final   Creatinine, Ser 06/18/2021 0.78  0.44 - 1.00 mg/dL Final   Calcium 06/18/2021 8.8 (A) 8.9 - 10.3 mg/dL Final   Total Protein 06/18/2021 6.8  6.5 - 8.1 g/dL Final   Albumin 06/18/2021 3.9  3.5 - 5.0 g/dL Final   AST 06/18/2021 21  15 - 41 U/L Final   ALT 06/18/2021 14  0 - 44 U/L Final   Alkaline Phosphatase 06/18/2021 82  38 - 126 U/L Final   Total Bilirubin 06/18/2021 0.5  0.3 - 1.2 mg/dL Final   GFR, Estimated 06/18/2021 >60  >60 mL/min Final   Comment: (NOTE) Calculated using the CKD-EPI Creatinine Equation (2021)    Anion gap 06/18/2021 7  5 - 15 Final   Performed at [facility], [address] [place], [place] [address]   ABO/RH(D) 06/18/2021 O POS   Final   Antibody Screen 06/18/2021 NEG   Final   Sample Expiration 06/18/2021 07/02/2021,2359   Final   Extend sample reason  06/18/2021    Final                   Value:NO TRANSFUSIONS OR PREGNANCY IN THE PAST 3 MONTHS Performed at [facility], [address] [place], [place] [address]    Prothrombin Time 06/18/2021 16.1 (A) 11.4 - 15.2 seconds Final   INR 06/18/2021 1.3 (A) 0.8 - 1.2 Final   Comment: (NOTE) INR goal  varies based on device and disease states. Performed at [facility], [address] [place], [place] [address]    aPTT 06/18/2021 33  24 - 36 seconds Final   Performed at [facility], [place], [place] [address]   Color, Urine 06/18/2021 YELLOW  YELLOW Final   APPearance 06/18/2021 CLEAR  CLEAR Final   Specific Gravity, Urine 06/18/2021 1.025  1.005 - 1.030 Final   pH 06/18/2021 5.0  5.0 - 8.0 Final   Glucose, UA 06/18/2021 NEGATIVE  NEGATIVE mg/dL Final   Hgb urine dipstick 06/18/2021 NEGATIVE  NEGATIVE Final   Bilirubin Urine 06/18/2021 NEGATIVE  NEGATIVE Final   Ketones, ur 06/18/2021 NEGATIVE  NEGATIVE mg/dL Final   Protein, ur 06/18/2021 NEGATIVE  NEGATIVE mg/dL Final   Nitrite 06/18/2021 NEGATIVE  NEGATIVE Final   Leukocytes,Ua 06/18/2021 NEGATIVE  NEGATIVE Final   Comment: Microscopic not done on urines with negative protein, blood, leukocytes, nitrite, or glucose < 500 mg/dL. Performed at [facility], [address] [place], [place] [address]    Specimen Description 06/18/2021    Final                   Value:URINE, [facility] CATCH Performed at [facility], [address] [place], [place] [address]    Special Requests 06/18/2021    Final                   Value:NONE Performed at [facility], [place] [address], [place],  [address]    Culture 06/18/2021 MULTIPLE SPECIES PRESENT, SUGGEST RECOLLECTION (A)  Final   Report Status 06/18/2021 06/19/2021 FINAL   Final    ECG: Date: 06/18/2021 Time ECG obtained: 0921 AM Rate: 69 bpm Rhythm: normal sinus Axis (leads I and aVF): Normal Intervals: PR 160  ms. QRS 72 ms. QTc 428 ms. ST segment and T wave changes: No evidence of acute ST segment elevation or depression Comparison: Previous tracing performed on 02/28/2021 showed atrial fibrillation.   IMAGING / PROCEDURES: TRANSTHORACIC ECHOCARDIOGRAM performed on 03/01/2021 Left ventricular systolic function normal with a visually estimated EF of 60-65% Left ventricle is normal in size with mildly increased wall thickness Mild mitral annular calcification present Left atrium mildly dilated in size Right ventricle is normal in size with normal systolic function No evidence of significant valvular regurgitation No valvular stenosis No evidence of a pericardial effusion  LEXISCAN performed on 06/14/2019 LVEF >65% No evidence for significant ischemia or scar noted No significant coronary calcifications noted on attenuation CT Normal myocardial perfusion study  EXERCISE TREADMILL TEST performed on 03/03/2017 Overall exercise capacity fair No significant angina/anginal equivalent symptoms reported Nonspecific ST segment changes in the inferior leads during early recovery that normalized by 2 minutes into recovery No significant arrhythmia noted Duke treadmill score 4; angina score 0 Estimated workload 7.0 METS Negative ETT for ischemia at workload achieved  Impression and Plan:  Madhavi Vaccari has been referred for pre-anesthesia review and clearance prior to her undergoing the planned anesthetic and procedural courses. Available labs, pertinent testing, and imaging results were personally reviewed by me. This patient has been appropriately cleared by cardiology with an overall ACCEPTABLE risk of significant perioperative cardiovascular complications.  Based on clinical review performed today (06/26/21), barring any significant acute changes in the patient's overall condition, it is anticipated that she will be able to proceed with the planned surgical intervention. Any acute changes in  clinical condition may necessitate her procedure being postponed  and/or cancelled. Patient will meet with anesthesia team (MD and/or CRNA) on the day of her procedure for preoperative evaluation/assessment. Questions regarding anesthetic course will be fielded at that time.   Pre-surgical instructions were reviewed with the patient during her PAT appointment and questions were fielded by PAT clinical staff. Patient was advised that if any questions or concerns arise prior to her procedure then she should return a call to PAT and/or her surgeon's office to discuss.  Honor Loh, MSN, APRN, FNP-C, CEN [facility]  Peri-operative Services Nurse Practitioner Phone: [phone] Fax: [phone] 06/26/21 8:35 AM  NOTE: This note has been prepared using Dragon dictation software. Despite my best ability to proofread, there is always the potential that unintentional transcriptional errors may still occur from this process.

## 2021-06-28 ENCOUNTER — Observation Stay: Payer: Medicare PPO

## 2021-06-28 ENCOUNTER — Encounter
Admission: RE | Disposition: A | Discharge status: Home / Self Care | Payer: Self-pay | Source: Home / Self Care | Attending: Orthopedic Surgery

## 2021-06-28 ENCOUNTER — Other Ambulatory Visit: Payer: Self-pay

## 2021-06-28 ENCOUNTER — Observation Stay
Admission: RE | Admit: 2021-06-28 | Discharge: 2021-06-30 | Disposition: A | Discharge status: Home / Self Care | Payer: Medicare PPO | Attending: Orthopedic Surgery | Admitting: Orthopedic Surgery

## 2021-06-28 ENCOUNTER — Ambulatory Visit: Payer: Medicare PPO | Admitting: Urgent Care

## 2021-06-28 ENCOUNTER — Encounter: Payer: Self-pay | Admitting: Orthopedic Surgery

## 2021-06-28 DIAGNOSIS — E039 Hypothyroidism, unspecified: Secondary | ICD-10-CM | POA: Insufficient documentation

## 2021-06-28 DIAGNOSIS — J449 Chronic obstructive pulmonary disease, unspecified: Secondary | ICD-10-CM | POA: Diagnosis not present

## 2021-06-28 DIAGNOSIS — M1711 Unilateral primary osteoarthritis, right knee: Secondary | ICD-10-CM | POA: Diagnosis not present

## 2021-06-28 DIAGNOSIS — Z96659 Presence of unspecified artificial knee joint: Secondary | ICD-10-CM

## 2021-06-28 DIAGNOSIS — Z7901 Long term (current) use of anticoagulants: Secondary | ICD-10-CM | POA: Insufficient documentation

## 2021-06-28 DIAGNOSIS — Z79899 Other long term (current) drug therapy: Secondary | ICD-10-CM | POA: Diagnosis not present

## 2021-06-28 HISTORY — DX: Nontoxic multinodular goiter: E04.2

## 2021-06-28 HISTORY — PX: KNEE ARTHROPLASTY: SHX992

## 2021-06-28 HISTORY — DX: Unspecified cataract: H26.9

## 2021-06-28 HISTORY — DX: Chronic obstructive pulmonary disease, unspecified: J44.9

## 2021-06-28 HISTORY — DX: Supraventricular tachycardia, unspecified: I47.10

## 2021-06-28 HISTORY — DX: Sleep disorder, unspecified: G47.9

## 2021-06-28 HISTORY — DX: Long term (current) use of anticoagulants: Z79.01

## 2021-06-28 HISTORY — DX: Unspecified atrial fibrillation: I48.91

## 2021-06-28 HISTORY — DX: Hyperlipidemia, unspecified: E78.5

## 2021-06-28 HISTORY — DX: Supraventricular tachycardia: I47.1

## 2021-06-28 HISTORY — DX: Dyshidrosis (pompholyx): L30.1

## 2021-06-28 LAB — ABO/RH: ABO/RH(D): O POS

## 2021-06-28 SURGERY — ARTHROPLASTY, KNEE, TOTAL, USING IMAGELESS COMPUTER-ASSISTED NAVIGATION
Anesthesia: General | Site: Knee | Laterality: Right

## 2021-06-28 MED ORDER — BISACODYL 10 MG RE SUPP: 10.0000 mg | Freq: Every day | RECTAL | Status: DC | PRN

## 2021-06-28 MED ORDER — ACETAMINOPHEN 325 MG PO TABS: 325.0000 mg | ORAL_TABLET | Freq: Four times a day (QID) | ORAL | Status: DC | PRN

## 2021-06-28 MED ORDER — SODIUM CHLORIDE (PF) 0.9 % IJ SOLN
INTRAMUSCULAR | Status: DC | PRN
  Administered 2021-06-28: 120 mL

## 2021-06-28 MED ORDER — TRANEXAMIC ACID-NACL 1000-0.7 MG/100ML-% IV SOLN
1000.0000 mg | INTRAVENOUS | Status: AC
  Administered 2021-06-28: 1000 mg via INTRAVENOUS

## 2021-06-28 MED ORDER — DEXAMETHASONE SODIUM PHOSPHATE 10 MG/ML IJ SOLN
INTRAMUSCULAR | Status: DC | PRN
  Administered 2021-06-28: 10 mg via INTRAVENOUS

## 2021-06-28 MED ORDER — 0.9 % SODIUM CHLORIDE (POUR BTL) OPTIME
TOPICAL | Status: DC | PRN
  Administered 2021-06-28: 1000 mL

## 2021-06-28 MED ORDER — PROPOFOL 10 MG/ML IV BOLUS
INTRAVENOUS | Status: AC
  Filled 2021-06-28: qty 20

## 2021-06-28 MED ORDER — PROPOFOL 500 MG/50ML IV EMUL
INTRAVENOUS | Status: DC | PRN
  Administered 2021-06-28: 100 ug/kg/min via INTRAVENOUS

## 2021-06-28 MED ORDER — KETAMINE HCL 50 MG/5ML IJ SOSY
PREFILLED_SYRINGE | INTRAMUSCULAR | Status: AC
  Filled 2021-06-28: qty 5

## 2021-06-28 MED ORDER — TRANEXAMIC ACID-NACL 1000-0.7 MG/100ML-% IV SOLN
INTRAVENOUS | Status: AC
  Filled 2021-06-28: qty 100

## 2021-06-28 MED ORDER — ALUM & MAG HYDROXIDE-SIMETH 200-200-20 MG/5ML PO SUSP: 30.0000 mL | ORAL | Status: DC | PRN

## 2021-06-28 MED ORDER — CHLORHEXIDINE GLUCONATE 0.12 % MT SOLN
OROMUCOSAL | Status: AC
  Administered 2021-06-28: 15 mL via OROMUCOSAL
  Filled 2021-06-28: qty 15

## 2021-06-28 MED ORDER — METOPROLOL SUCCINATE ER 25 MG PO TB24
12.5000 mg | ORAL_TABLET | Freq: Every day | ORAL | Status: DC
  Administered 2021-06-28: 12.5 mg via ORAL
  Filled 2021-06-28 (×2): qty 1

## 2021-06-28 MED ORDER — ONDANSETRON HCL 4 MG/2ML IJ SOLN: 4.0000 mg | Freq: Once | INTRAMUSCULAR | Status: DC | PRN

## 2021-06-28 MED ORDER — LACTATED RINGERS IV SOLN: INTRAVENOUS | Status: DC

## 2021-06-28 MED ORDER — FAMOTIDINE 20 MG PO TABS
ORAL_TABLET | ORAL | Status: AC
  Administered 2021-06-28: 20 mg via ORAL
  Filled 2021-06-28: qty 1

## 2021-06-28 MED ORDER — MIDAZOLAM HCL 2 MG/2ML IJ SOLN
INTRAMUSCULAR | Status: AC
  Filled 2021-06-28: qty 2

## 2021-06-28 MED ORDER — ORAL CARE MOUTH RINSE: 15.0000 mL | Freq: Once | OROMUCOSAL | Status: AC

## 2021-06-28 MED ORDER — MENTHOL 3 MG MT LOZG
1.0000 | LOZENGE | OROMUCOSAL | Status: DC | PRN
  Filled 2021-06-28: qty 9

## 2021-06-28 MED ORDER — SURGIPHOR WOUND IRRIGATION SYSTEM - OPTIME
TOPICAL | Status: DC | PRN
  Administered 2021-06-28: 1 via TOPICAL

## 2021-06-28 MED ORDER — LIDOCAINE HCL (CARDIAC) PF 100 MG/5ML IV SOSY
PREFILLED_SYRINGE | INTRAVENOUS | Status: DC | PRN
  Administered 2021-06-28: 60 mg via INTRAVENOUS

## 2021-06-28 MED ORDER — SODIUM CHLORIDE 0.9 % IV SOLN
INTRAVENOUS | Status: DC | PRN
  Administered 2021-06-28: 20 ug/min via INTRAVENOUS

## 2021-06-28 MED ORDER — ONDANSETRON HCL 4 MG/2ML IJ SOLN
INTRAMUSCULAR | Status: DC | PRN
  Administered 2021-06-28: 4 mg via INTRAVENOUS

## 2021-06-28 MED ORDER — PROPOFOL 1000 MG/100ML IV EMUL
INTRAVENOUS | Status: AC
  Filled 2021-06-28: qty 100

## 2021-06-28 MED ORDER — FAMOTIDINE 20 MG PO TABS: 20.0000 mg | ORAL_TABLET | Freq: Once | ORAL | Status: AC

## 2021-06-28 MED ORDER — OXYCODONE HCL 5 MG PO TABS
5.0000 mg | ORAL_TABLET | ORAL | Status: DC | PRN
  Administered 2021-06-28 – 2021-06-30 (×2): 5 mg via ORAL
  Filled 2021-06-28: qty 1

## 2021-06-28 MED ORDER — MIDAZOLAM HCL 5 MG/5ML IJ SOLN
INTRAMUSCULAR | Status: DC | PRN
  Administered 2021-06-28 (×2): 1 mg via INTRAVENOUS

## 2021-06-28 MED ORDER — EPHEDRINE SULFATE 50 MG/ML IJ SOLN
INTRAMUSCULAR | Status: DC | PRN
  Administered 2021-06-28: 5 mg via INTRAVENOUS

## 2021-06-28 MED ORDER — SCOPOLAMINE 1 MG/3DAYS TD PT72
MEDICATED_PATCH | TRANSDERMAL | Status: AC
  Administered 2021-06-28: 1.5 mg via TRANSDERMAL
  Filled 2021-06-28: qty 1

## 2021-06-28 MED ORDER — SODIUM CHLORIDE 0.9 % IR SOLN
Status: DC | PRN
  Administered 2021-06-28: 3000 mL

## 2021-06-28 MED ORDER — FENTANYL CITRATE (PF) 100 MCG/2ML IJ SOLN
INTRAMUSCULAR | Status: DC | PRN
  Administered 2021-06-28 (×3): 25 ug via INTRAVENOUS

## 2021-06-28 MED ORDER — EPHEDRINE 5 MG/ML INJ
INTRAVENOUS | Status: AC
  Filled 2021-06-28: qty 5

## 2021-06-28 MED ORDER — APIXABAN 5 MG PO TABS
5.0000 mg | ORAL_TABLET | Freq: Two times a day (BID) | ORAL | Status: DC
  Administered 2021-06-29 – 2021-06-30 (×3): 5 mg via ORAL
  Filled 2021-06-28 (×3): qty 1

## 2021-06-28 MED ORDER — DEXAMETHASONE SODIUM PHOSPHATE 10 MG/ML IJ SOLN: 8.0000 mg | Freq: Once | INTRAMUSCULAR | Status: AC

## 2021-06-28 MED ORDER — ACETAMINOPHEN 10 MG/ML IV SOLN
INTRAVENOUS | Status: DC | PRN
  Administered 2021-06-28: 1000 mg via INTRAVENOUS

## 2021-06-28 MED ORDER — ONDANSETRON HCL 4 MG/2ML IJ SOLN: 4.0000 mg | Freq: Four times a day (QID) | INTRAMUSCULAR | Status: DC | PRN

## 2021-06-28 MED ORDER — DIPHENHYDRAMINE HCL 12.5 MG/5ML PO ELIX: 12.5000 mg | ORAL_SOLUTION | ORAL | Status: DC | PRN

## 2021-06-28 MED ORDER — ONDANSETRON HCL 4 MG PO TABS: 4.0000 mg | ORAL_TABLET | Freq: Four times a day (QID) | ORAL | Status: DC | PRN

## 2021-06-28 MED ORDER — TRANEXAMIC ACID-NACL 1000-0.7 MG/100ML-% IV SOLN: 1000.0000 mg | Freq: Once | INTRAVENOUS | Status: AC

## 2021-06-28 MED ORDER — ACETAMINOPHEN 10 MG/ML IV SOLN
1000.0000 mg | Freq: Four times a day (QID) | INTRAVENOUS | Status: AC
  Administered 2021-06-28 – 2021-06-29 (×4): 1000 mg via INTRAVENOUS
  Filled 2021-06-28 (×4): qty 100

## 2021-06-28 MED ORDER — MAGNESIUM HYDROXIDE 400 MG/5ML PO SUSP
30.0000 mL | Freq: Every day | ORAL | Status: DC
  Administered 2021-06-28 – 2021-06-29 (×2): 30 mL via ORAL
  Filled 2021-06-28 (×3): qty 30

## 2021-06-28 MED ORDER — PROPOFOL 10 MG/ML IV BOLUS
INTRAVENOUS | Status: DC | PRN
  Administered 2021-06-28: 120 mg via INTRAVENOUS

## 2021-06-28 MED ORDER — SODIUM CHLORIDE 0.9 % IV SOLN: INTRAVENOUS | Status: DC

## 2021-06-28 MED ORDER — CELECOXIB 200 MG PO CAPS
200.0000 mg | ORAL_CAPSULE | Freq: Two times a day (BID) | ORAL | Status: DC
  Administered 2021-06-28 – 2021-06-30 (×4): 200 mg via ORAL
  Filled 2021-06-28 (×4): qty 1

## 2021-06-28 MED ORDER — FERROUS SULFATE 325 (65 FE) MG PO TABS
325.0000 mg | ORAL_TABLET | Freq: Two times a day (BID) | ORAL | Status: DC
  Administered 2021-06-29 – 2021-06-30 (×3): 325 mg via ORAL
  Filled 2021-06-28 (×3): qty 1

## 2021-06-28 MED ORDER — BUPIVACAINE LIPOSOME 1.3 % IJ SUSP
INTRAMUSCULAR | Status: AC
  Filled 2021-06-28: qty 20

## 2021-06-28 MED ORDER — CITALOPRAM HYDROBROMIDE 10 MG PO TABS
10.0000 mg | ORAL_TABLET | Freq: Every day | ORAL | Status: DC
  Administered 2021-06-29 – 2021-06-30 (×2): 10 mg via ORAL
  Filled 2021-06-28 (×2): qty 1

## 2021-06-28 MED ORDER — KETAMINE HCL 10 MG/ML IJ SOLN
INTRAMUSCULAR | Status: DC | PRN
  Administered 2021-06-28 (×5): 10 mg via INTRAVENOUS

## 2021-06-28 MED ORDER — SIMVASTATIN 20 MG PO TABS
20.0000 mg | ORAL_TABLET | Freq: Every day | ORAL | Status: DC
  Administered 2021-06-29 – 2021-06-30 (×2): 20 mg via ORAL
  Filled 2021-06-28 (×2): qty 1

## 2021-06-28 MED ORDER — FLEET ENEMA 7-19 GM/118ML RE ENEM: 1.0000 | ENEMA | Freq: Once | RECTAL | Status: DC | PRN

## 2021-06-28 MED ORDER — TRANEXAMIC ACID-NACL 1000-0.7 MG/100ML-% IV SOLN
INTRAVENOUS | Status: AC
  Administered 2021-06-28: 1000 mg via INTRAVENOUS
  Filled 2021-06-28: qty 100

## 2021-06-28 MED ORDER — HYDROMORPHONE HCL 1 MG/ML IJ SOLN
0.5000 mg | INTRAMUSCULAR | Status: DC | PRN
Start: 2021-06-28 — End: 2021-06-30

## 2021-06-28 MED ORDER — SENNOSIDES-DOCUSATE SODIUM 8.6-50 MG PO TABS
1.0000 | ORAL_TABLET | Freq: Two times a day (BID) | ORAL | Status: DC
  Administered 2021-06-28 – 2021-06-29 (×3): 1 via ORAL
  Filled 2021-06-28 (×4): qty 1

## 2021-06-28 MED ORDER — FENTANYL CITRATE (PF) 100 MCG/2ML IJ SOLN
INTRAMUSCULAR | Status: AC
  Filled 2021-06-28: qty 2

## 2021-06-28 MED ORDER — DEXAMETHASONE SODIUM PHOSPHATE 10 MG/ML IJ SOLN
INTRAMUSCULAR | Status: AC
  Administered 2021-06-28: 8 mg via INTRAVENOUS
  Filled 2021-06-28: qty 1

## 2021-06-28 MED ORDER — GABAPENTIN 300 MG PO CAPS
ORAL_CAPSULE | ORAL | Status: AC
  Administered 2021-06-28: 300 mg via ORAL
  Filled 2021-06-28: qty 1

## 2021-06-28 MED ORDER — CELECOXIB 200 MG PO CAPS: 400.0000 mg | ORAL_CAPSULE | Freq: Once | ORAL | Status: AC

## 2021-06-28 MED ORDER — CHLORHEXIDINE GLUCONATE 4 % EX LIQD: 60.0000 mL | Freq: Once | CUTANEOUS | Status: DC

## 2021-06-28 MED ORDER — PANTOPRAZOLE SODIUM 40 MG PO TBEC
40.0000 mg | DELAYED_RELEASE_TABLET | Freq: Two times a day (BID) | ORAL | Status: DC
  Administered 2021-06-28 – 2021-06-30 (×4): 40 mg via ORAL
  Filled 2021-06-28 (×4): qty 1

## 2021-06-28 MED ORDER — CHLORHEXIDINE GLUCONATE 0.12 % MT SOLN: 15.0000 mL | Freq: Once | OROMUCOSAL | Status: AC

## 2021-06-28 MED ORDER — ENSURE PRE-SURGERY PO LIQD
296.0000 mL | Freq: Once | ORAL | Status: AC
  Administered 2021-06-28: 296 mL via ORAL
  Filled 2021-06-28: qty 296

## 2021-06-28 MED ORDER — BUPIVACAINE HCL (PF) 0.5 % IJ SOLN
INTRAMUSCULAR | Status: AC
  Filled 2021-06-28: qty 10

## 2021-06-28 MED ORDER — ACETAMINOPHEN 10 MG/ML IV SOLN
INTRAVENOUS | Status: AC
  Filled 2021-06-28: qty 100

## 2021-06-28 MED ORDER — FENTANYL CITRATE (PF) 100 MCG/2ML IJ SOLN: 25.0000 ug | INTRAMUSCULAR | Status: DC | PRN

## 2021-06-28 MED ORDER — BUPIVACAINE HCL (PF) 0.25 % IJ SOLN
INTRAMUSCULAR | Status: AC
  Filled 2021-06-28: qty 60

## 2021-06-28 MED ORDER — CEFAZOLIN SODIUM-DEXTROSE 2-4 GM/100ML-% IV SOLN
INTRAVENOUS | Status: AC
  Filled 2021-06-28: qty 100

## 2021-06-28 MED ORDER — CEFAZOLIN SODIUM-DEXTROSE 2-4 GM/100ML-% IV SOLN
2.0000 g | Freq: Four times a day (QID) | INTRAVENOUS | Status: AC
  Administered 2021-06-28 (×2): 2 g via INTRAVENOUS
  Filled 2021-06-28 (×2): qty 100

## 2021-06-28 MED ORDER — CEFAZOLIN SODIUM-DEXTROSE 2-4 GM/100ML-% IV SOLN
2.0000 g | INTRAVENOUS | Status: AC
  Administered 2021-06-28: 2 g via INTRAVENOUS

## 2021-06-28 MED ORDER — CELECOXIB 200 MG PO CAPS
ORAL_CAPSULE | ORAL | Status: AC
  Administered 2021-06-28: 400 mg via ORAL
  Filled 2021-06-28: qty 2

## 2021-06-28 MED ORDER — PHENOL 1.4 % MT LIQD
1.0000 | OROMUCOSAL | Status: DC | PRN
  Filled 2021-06-28: qty 177

## 2021-06-28 MED ORDER — GABAPENTIN 300 MG PO CAPS: 300.0000 mg | ORAL_CAPSULE | Freq: Once | ORAL | Status: AC

## 2021-06-28 MED ORDER — SCOPOLAMINE 1 MG/3DAYS TD PT72: 1.0000 | MEDICATED_PATCH | TRANSDERMAL | Status: DC

## 2021-06-28 MED ORDER — SODIUM CHLORIDE FLUSH 0.9 % IV SOLN
INTRAVENOUS | Status: AC
  Filled 2021-06-28: qty 40

## 2021-06-28 MED ORDER — OXYCODONE HCL 5 MG PO TABS
10.0000 mg | ORAL_TABLET | ORAL | Status: DC | PRN
  Filled 2021-06-28: qty 2

## 2021-06-28 MED ORDER — METOCLOPRAMIDE HCL 10 MG PO TABS
10.0000 mg | ORAL_TABLET | Freq: Three times a day (TID) | ORAL | Status: DC
  Administered 2021-06-28 – 2021-06-30 (×5): 10 mg via ORAL
  Filled 2021-06-28 (×7): qty 1

## 2021-06-28 MED ORDER — TRAMADOL HCL 50 MG PO TABS
50.0000 mg | ORAL_TABLET | ORAL | Status: DC | PRN
  Administered 2021-06-29: 50 mg via ORAL
  Filled 2021-06-28: qty 1

## 2021-06-28 SURGICAL SUPPLY — 76 items
ATTUNE PSFEM RTSZ5 NARCEM KNEE (Femur) ×1 IMPLANT
ATTUNE PSRP INSR SZ5 5 KNEE (Insert) ×1 IMPLANT
BASE TIBIAL ROT PLAT SZ 5 KNEE (Knees) IMPLANT
BATTERY INSTRU NAVIGATION (MISCELLANEOUS) ×8 IMPLANT
BLADE SAW 70X12.5 (BLADE) ×2 IMPLANT
BLADE SAW 90X13X1.19 OSCILLAT (BLADE) ×2 IMPLANT
BLADE SAW 90X25X1.19 OSCILLAT (BLADE) ×2 IMPLANT
BONE CEMENT GENTAMICIN (Cement) ×4 IMPLANT
CEMENT BONE GENTAMICIN 40 (Cement) IMPLANT
COOLER POLAR GLACIER W/PUMP (MISCELLANEOUS) ×2 IMPLANT
CUFF TOURN SGL QUICK 24 (TOURNIQUET CUFF)
CUFF TOURN SGL QUICK 34 (TOURNIQUET CUFF)
CUFF TRNQT CYL 24X4X16.5-23 (TOURNIQUET CUFF) IMPLANT
CUFF TRNQT CYL 34X4.125X (TOURNIQUET CUFF) IMPLANT
DRAPE 3/4 80X56 (DRAPES) ×2 IMPLANT
DRAPE INCISE IOBAN 66X45 STRL (DRAPES) ×2 IMPLANT
DRSG DERMACEA 8X12 NADH (GAUZE/BANDAGES/DRESSINGS) ×2 IMPLANT
DRSG MEPILEX SACRM 8.7X9.8 (GAUZE/BANDAGES/DRESSINGS) ×2 IMPLANT
DRSG OPSITE POSTOP 4X14 (GAUZE/BANDAGES/DRESSINGS) ×2 IMPLANT
DRSG TEGADERM 4X4.75 (GAUZE/BANDAGES/DRESSINGS) ×2 IMPLANT
DURAPREP 26ML APPLICATOR (WOUND CARE) ×4 IMPLANT
ELECT CAUTERY BLADE 6.4 (BLADE) ×2 IMPLANT
ELECT REM PT RETURN 9FT ADLT (ELECTROSURGICAL) ×2
ELECTRODE REM PT RTRN 9FT ADLT (ELECTROSURGICAL) ×1 IMPLANT
EX-PIN ORTHOLOCK NAV 4X150 (PIN) ×4 IMPLANT
GAUZE 4X4 16PLY ~~LOC~~+RFID DBL (SPONGE) IMPLANT
GLOVE SURG ENC TEXT LTX SZ7.5 (GLOVE) ×4 IMPLANT
GLOVE SURG UNDER POLY LF SZ7.5 (GLOVE) ×4 IMPLANT
GOWN STRL REUS W/ TWL LRG LVL3 (GOWN DISPOSABLE) ×2 IMPLANT
GOWN STRL REUS W/ TWL XL LVL3 (GOWN DISPOSABLE) ×1 IMPLANT
GOWN STRL REUS W/TWL LRG LVL3 (GOWN DISPOSABLE) ×2
GOWN STRL REUS W/TWL XL LVL3 (GOWN DISPOSABLE) ×1
HEMOVAC 400CC 10FR (MISCELLANEOUS) ×2 IMPLANT
HOLDER FOLEY CATH W/STRAP (MISCELLANEOUS) ×2 IMPLANT
HOOD PEEL AWAY FLYTE STAYCOOL (MISCELLANEOUS) ×1 IMPLANT
IRRIGATION SURGIPHOR STRL (IV SOLUTION) ×2 IMPLANT
IV NS IRRIG 3000ML ARTHROMATIC (IV SOLUTION) ×2 IMPLANT
KIT TURNOVER KIT A (KITS) ×2 IMPLANT
KNIFE SCULPS 14X20 (INSTRUMENTS) ×2 IMPLANT
LABEL OR SOLS (LABEL) ×2 IMPLANT
MANIFOLD NEPTUNE II (INSTRUMENTS) ×4 IMPLANT
NDL SAFETY ECLIPSE 18X1.5 (NEEDLE) ×1 IMPLANT
NDL SPNL 20GX3.5 QUINCKE YW (NEEDLE) ×2 IMPLANT
NEEDLE HYPO 18GX1.5 SHARP (NEEDLE) ×1
NEEDLE SPNL 20GX3.5 QUINCKE YW (NEEDLE) ×4 IMPLANT
NS IRRIG 500ML POUR BTL (IV SOLUTION) ×2 IMPLANT
PACK TOTAL KNEE (MISCELLANEOUS) ×2 IMPLANT
PAD ABD DERMACEA PRESS 5X9 (GAUZE/BANDAGES/DRESSINGS) ×4 IMPLANT
PAD WRAPON POLAR KNEE (MISCELLANEOUS) ×1 IMPLANT
PATELLA MEDIAL ATTUN 35MM KNEE (Knees) ×1 IMPLANT
PENCIL SMOKE EVACUATOR COATED (MISCELLANEOUS) ×2 IMPLANT
PIN DRILL FIX HALF THREAD (BIT) ×4 IMPLANT
PIN DRILL QUICK PACK ×9 IMPLANT
PIN FIXATION 1/8DIA X 3INL (PIN) ×2 IMPLANT
PULSAVAC PLUS IRRIG FAN TIP (DISPOSABLE) ×2
SOL PREP PVP 2OZ (MISCELLANEOUS) ×2
SOLUTION PREP PVP 2OZ (MISCELLANEOUS) ×1 IMPLANT
SPONGE DRAIN TRACH 4X4 STRL 2S (GAUZE/BANDAGES/DRESSINGS) ×2 IMPLANT
SPONGE T-LAP 18X18 ~~LOC~~+RFID (SPONGE) ×6 IMPLANT
STAPLER SKIN PROX 35W (STAPLE) ×2 IMPLANT
STOCKINETTE IMPERV 14X48 (MISCELLANEOUS) IMPLANT
STRAP TIBIA SHORT (MISCELLANEOUS) ×2 IMPLANT
SUCTION FRAZIER HANDLE 10FR (MISCELLANEOUS) ×1
SUCTION TUBE FRAZIER 10FR DISP (MISCELLANEOUS) ×1 IMPLANT
SUT VIC AB 0 CT1 36 (SUTURE) ×4 IMPLANT
SUT VIC AB 1 CT1 36 (SUTURE) ×4 IMPLANT
SUT VIC AB 2-0 CT2 27 (SUTURE) ×2 IMPLANT
SYR 20ML LL LF (SYRINGE) ×2 IMPLANT
SYR 30ML LL (SYRINGE) ×4 IMPLANT
TIBIAL BASE ROT PLAT SZ 5 KNEE (Knees) ×2 IMPLANT
TIP FAN IRRIG PULSAVAC PLUS (DISPOSABLE) ×1 IMPLANT
TOWEL OR 17X26 4PK STRL BLUE (TOWEL DISPOSABLE) ×2 IMPLANT
TOWER CARTRIDGE SMART MIX (DISPOSABLE) ×2 IMPLANT
TRAY FOLEY MTR SLVR 16FR STAT (SET/KITS/TRAYS/PACK) ×2 IMPLANT
WATER STERILE IRR 500ML POUR (IV SOLUTION) ×2 IMPLANT
WRAPON POLAR PAD KNEE (MISCELLANEOUS) ×2

## 2021-06-28 NOTE — Anesthesia Procedure Notes (Signed)
Procedure Name: LMA Insertion Date/Time: 06/28/2021 12:24 PM Performed by: Debe Coder, CRNA Pre-anesthesia Checklist: Patient identified, Emergency Drugs available, Suction available and Patient being monitored Patient Re-evaluated:Patient Re-evaluated prior to induction Oxygen Delivery Method: Circle system utilized Preoxygenation: Pre-oxygenation with 100% oxygen Induction Type: IV induction Ventilation: Mask ventilation without difficulty LMA: LMA inserted LMA Size: 3.0 Number of attempts: 1 Placement Confirmation: ETT inserted through vocal cords under direct vision, positive ETCO2 and breath sounds checked- equal and bilateral Tube secured with: Tape Dental Injury: Teeth and Oropharynx as per pre-operative assessment

## 2021-06-28 NOTE — H&P (Signed)
The patient has been re-examined, and the chart reviewed, and there have been no interval changes to the documented history and physical.    The risks, benefits, and alternatives have been discussed at length. The patient expressed understanding of the risks benefits and agreed with plans for surgical intervention.  Nykerria Macconnell P. Artesia Berkey, Jr. M.D.    

## 2021-06-28 NOTE — Anesthesia Preprocedure Evaluation (Signed)
Anesthesia Evaluation  Patient identified by MRN, date of birth, ID band Patient awake    Reviewed: Allergy & Precautions, H&P , NPO status , Patient's Chart, lab work & pertinent test results, reviewed documented beta blocker date and time   Airway Mallampati: II   Neck ROM: full    Dental  (+) Teeth Intact   Pulmonary pneumonia, resolved, COPD,    Pulmonary exam normal        Cardiovascular Exercise Tolerance: Good negative cardio ROS  Atrial Fibrillation  Rhythm:regular Rate:Normal     Neuro/Psych Depression negative neurological ROS  negative psych ROS   GI/Hepatic negative GI ROS, Neg liver ROS,   Endo/Other  Hypothyroidism   Renal/GU negative Renal ROS  negative genitourinary   Musculoskeletal   Abdominal   Peds  Hematology negative hematology ROS (+)   Anesthesia Other Findings Past Medical History: No date: Arthritis No date: Atrial fibrillation (HCC) No date: Cataracts, bilateral No date: Chronic anticoagulation     Comment:  Apixaban No date: COPD (chronic obstructive pulmonary disease) (HCC) No date: Depression No date: Dyshidrotic eczema 12/21/2019: Dysplastic nevus     Comment:  a.) L lat waistline - moderate. b.) L ant waistline near              groin - moderate 10/21/2020: History of 2019 novel coronavirus disease (COVID-19) No date: HLD (hyperlipidemia) No date: Hypothyroidism No date: Multiple thyroid nodules 2017: Pneumonia No date: Sleep difficulties     Comment:  Takes melatonin No date: SVT (supraventricular tachycardia) (HCC) Past Surgical History: No date: CESAREAN SECTION 2014: EYE SURGERY; Bilateral No date: THYROIDECTOMY, PARTIAL BMI    Body Mass Index: 30.55 kg/m     Reproductive/Obstetrics negative OB ROS                             Anesthesia Physical Anesthesia Plan  ASA: 3  Anesthesia Plan: Spinal   Post-op Pain Management:     Induction:   PONV Risk Score and Plan: 4 or greater  Airway Management Planned:   Additional Equipment:   Intra-op Plan:   Post-operative Plan:   Informed Consent: I have reviewed the patients History and Physical, chart, labs and discussed the procedure including the risks, benefits and alternatives for the proposed anesthesia with the patient or authorized representative who has indicated his/her understanding and acceptance.     Dental Advisory Given  Plan Discussed with: CRNA  Anesthesia Plan Comments:         Anesthesia Quick Evaluation

## 2021-06-28 NOTE — Transfer of Care (Signed)
Immediate Anesthesia Transfer of Care Note  Patient: Kayla Zamora  Procedure(s) Performed: COMPUTER ASSISTED TOTAL KNEE ARTHROPLASTY - RNFA (Right: Knee)  Patient Location: PACU  Anesthesia Type:General  Level of Consciousness: drowsy  Airway & Oxygen Therapy: Patient Spontanous Breathing and Patient connected to face mask oxygen  Post-op Assessment: Report given to RN and Post -op Vital signs reviewed and stable  Post vital signs: Reviewed and stable  Last Vitals:  Vitals Value Taken Time  BP 94/45 06/28/21 1548  Temp 36.9 C 06/28/21 1548  Pulse 89 06/28/21 1554  Resp 14 06/28/21 1554  SpO2 96 % 06/28/21 1554  Vitals shown include unvalidated device data.  Last Pain:  Vitals:   06/28/21 0958  TempSrc: Oral  PainSc: 0-No pain         Complications: No notable events documented.

## 2021-06-28 NOTE — Op Note (Signed)
OPERATIVE NOTE  DATE OF SURGERY:  06/28/2021  PATIENT NAME:  Kayla Zamora   DOB: 03/03/1946  MRN: [id number]  PRE-OPERATIVE DIAGNOSIS: Degenerative arthrosis of the right knee, primary  POST-OPERATIVE DIAGNOSIS:  Same  PROCEDURE:  Right total knee arthroplasty using computer-assisted navigation  SURGEON:  Marciano Sequin. M.D.  ANESTHESIA: spinal and general  ESTIMATED BLOOD LOSS: 75 mL  FLUIDS REPLACED: 1000 mL of crystalloid  TOURNIQUET TIME: 86 minutes  DRAINS: 2 medium Hemovac drains  SOFT TISSUE RELEASES: Anterior cruciate ligament, posterior cruciate ligament, deep  medial collateral ligament, patellofemoral ligament  IMPLANTS UTILIZED: DePuy Attune size 5N posterior stabilized femoral component (cemented), size 5 rotating platform tibial component (cemented), 35 mm medialized dome patella (cemented), and a 5 mm stabilized rotating platform polyethylene insert.  INDICATIONS FOR SURGERY: Kayla Zamora is a 75 y.o. year old female with a long history of progressive knee pain. X-rays demonstrated severe degenerative changes in tricompartmental fashion. The patient had not seen any significant improvement despite conservative nonsurgical intervention. After discussion of the risks and benefits of surgical intervention, the patient expressed understanding of the risks benefits and agree with plans for total knee arthroplasty.   The risks, benefits, and alternatives were discussed at length including but not limited to the risks of infection, bleeding, nerve injury, stiffness, blood clots, the need for revision surgery, cardiopulmonary complications, among others, and they were willing to proceed.  PROCEDURE IN DETAIL: The patient was brought into the operating room and, after adequate spinal and general anesthesia was achieved, a tourniquet was placed on the patient's upper thigh. The patient's knee and leg were cleaned and prepped with alcohol and DuraPrep and draped in the  usual sterile fashion. A "timeout" was performed as per usual protocol. The lower extremity was exsanguinated using an Esmarch, and the tourniquet was inflated to 300 mmHg. An anterior longitudinal incision was made followed by a standard mid vastus approach. The deep fibers of the medial collateral ligament were elevated in a subperiosteal fashion off of the medial flare of the tibia so as to maintain a continuous soft tissue sleeve. The patella was subluxed laterally and the patellofemoral ligament was incised. Inspection of the knee demonstrated severe degenerative changes with full-thickness loss of articular cartilage. Osteophytes were debrided using a rongeur. Anterior and posterior cruciate ligaments were excised. Two 4.0 mm Schanz pins were inserted in the femur and into the tibia for attachment of the array of trackers used for computer-assisted navigation. Hip center was identified using a circumduction technique. Distal landmarks were mapped using the computer. The distal femur and proximal tibia were mapped using the computer. The distal femoral cutting guide was positioned using computer-assisted navigation so as to achieve a 5 distal valgus cut. The femur was sized and it was felt that a size 5N femoral component was appropriate. A size 5 femoral cutting guide was positioned and the anterior cut was performed and verified using the computer. This was followed by completion of the posterior and chamfer cuts. Femoral cutting guide for the central box was then positioned in the center box cut was performed.  Attention was then directed to the proximal tibia. Medial and lateral menisci were excised. The extramedullary tibial cutting guide was positioned using computer-assisted navigation so as to achieve a 0 varus-valgus alignment and 3 posterior slope. The cut was performed and verified using the computer. The proximal tibia was sized and it was felt that a size 5 tibial tray was appropriate. Tibial  and femoral trials  were inserted followed by insertion of a 5 mm polyethylene insert. This allowed for excellent mediolateral soft tissue balancing both in flexion and in full extension. Finally, the patella was cut and prepared so as to accommodate a 35 mm medialized dome patella. A patella trial was placed and the knee was placed through a range of motion with excellent patellar tracking appreciated. The femoral trial was removed after debridement of posterior osteophytes. The central post-hole for the tibial component was reamed followed by insertion of a keel punch. Tibial trials were then removed. Cut surfaces of bone were irrigated with copious amounts of normal saline using pulsatile lavage and then suctioned dry. Polymethylmethacrylate cement with gentamicin was prepared in the usual fashion using a vacuum mixer. Cement was applied to the cut surface of the proximal tibia as well as along the undersurface of a size 5 rotating platform tibial component. Tibial component was positioned and impacted into place. Excess cement was removed using [employer] fast streamer. Cement was then applied to the cut surfaces of the femur as well as along the posterior flanges of the size 5 femoral component. The femoral component was positioned and impacted into place. Excess cement was removed using [employer] fast streamer. A 5 mm polyethylene trial was inserted and the knee was brought into full extension with steady axial compression applied. Finally, cement was applied to the backside of a 35 mm medialized dome patella and the patellar component was positioned and patellar clamp applied. Excess cement was removed using [employer] fast streamer. After adequate curing of the cement, the tourniquet was deflated after a total tourniquet time of 86 minutes. Hemostasis was achieved using electrocautery. The knee was irrigated with copious amounts of normal saline using pulsatile lavage followed by 500 ml of Surgiphor and then suctioned dry. 20 mL of  1.3% Exparel and 60 mL of 0.25% Marcaine in 40 mL of normal saline was injected along the posterior capsule, medial and lateral gutters, and along the arthrotomy site. A 5 mm stabilized rotating platform polyethylene insert was inserted and the knee was placed through a range of motion with excellent mediolateral soft tissue balancing appreciated and excellent patellar tracking noted. 2 medium drains were placed in the wound bed and brought out through separate stab incisions. The medial parapatellar portion of the incision was reapproximated using interrupted sutures of #1 Vicryl. Subcutaneous tissue was approximated in layers using first #0 Vicryl followed #2-0 Vicryl. The skin was approximated with skin staples. A sterile dressing was applied.  The patient tolerated the procedure well and was transported to the recovery room in stable condition.    Britiny Defrain P. Holley Bouche., M.D.

## 2021-06-29 ENCOUNTER — Encounter: Payer: Self-pay | Admitting: Orthopedic Surgery

## 2021-06-29 DIAGNOSIS — M1711 Unilateral primary osteoarthritis, right knee: Secondary | ICD-10-CM | POA: Diagnosis not present

## 2021-06-29 MED ORDER — CELECOXIB 200 MG PO CAPS: 200.0000 mg | ORAL_CAPSULE | Freq: Two times a day (BID) | ORAL | 1 refills | Status: AC

## 2021-06-29 MED ORDER — TRAMADOL HCL 50 MG PO TABS: 50.0000 mg | ORAL_TABLET | ORAL | 0 refills | Status: DC | PRN

## 2021-06-29 MED ORDER — OXYCODONE HCL 5 MG PO TABS: 5.0000 mg | ORAL_TABLET | ORAL | 0 refills | Status: DC | PRN

## 2021-06-29 NOTE — Evaluation (Signed)
Physical Therapy Evaluation Patient Details Name: Kayla Zamora MRN: [id number] DOB: [birth year] Today's Date: 06/29/2021  History of Present Illness  Pt is a 75 yo F diagnosed with degenerative arthrosis of the right knee and is s/p elective R TKA. PMH includes: COPD and hypothyroidism.   Clinical Impression  Pt was pleasant and motivated to participate during the session and performed very well with all functional tasks although she did require extra cuing for proper sequencing with daughter present for training throughout the session.  Pt was steady with ambulation, transfers, and stairs per below with no adverse symptoms other than mild R knee pain.  Pt's SpO2 and HR were both WNL during the session. Pt will benefit from HHPT upon discharge to safely address deficits listed in patient problem list for decreased caregiver assistance and eventual return to PLOF.         Recommendations for follow up therapy are one component of a multi-disciplinary discharge planning process, led by the attending physician.  Recommendations may be updated based on patient status, additional functional criteria and insurance authorization.  Follow Up Recommendations Home health PT    Equipment Recommendations  None recommended by PT    Recommendations for Other Services       Precautions / Restrictions Precautions Precautions: Fall Restrictions Weight Bearing Restrictions: Yes RLE Weight Bearing: Weight bearing as tolerated Other Position/Activity Restrictions: No KI required, pt able to perform Ind RLE SLR without extensor lag      Mobility  Bed Mobility Overal bed mobility: Modified Independent             General bed mobility comments: Extra time and effort only    Transfers Overall transfer level: Needs assistance Equipment used: Rolling walker (2 wheeled) Transfers: Sit to/from Stand Sit to Stand: Min guard         General transfer comment: Mod verbal and visual cues for  sequencing  Ambulation/Gait Ambulation/Gait assistance: Min guard Gait Distance (Feet): 150 Feet Assistive device: Rolling walker (2 wheeled) Gait Pattern/deviations: Step-through pattern;Decreased step length - right;Decreased step length - left;Antalgic Gait velocity: decreased   General Gait Details: Mildy antalgic on RLE but step-through pattern with good stability  Stairs Stairs: Yes Stairs assistance: Min guard Stair Management: One rail Right;Forwards;Step to pattern Number of Stairs: 4 General stair comments: Pt able to ascend and descend 4 steps with one rail x 2 with daughter present for training; min to mod verbal and visual cues for sequencing with pt able to demonstrate very good eccentric and concentric control and stability  Wheelchair Mobility    Modified Rankin (Stroke Patients Only)       Balance Overall balance assessment: Needs assistance   Sitting balance-Leahy Scale: Normal     Standing balance support: Bilateral upper extremity supported;During functional activity Standing balance-Leahy Scale: Good Standing balance comment: Min lean on the RW for support                             Pertinent Vitals/Pain Pain Assessment: 0-10 Pain Score: 3  Pain Location: R knee Pain Descriptors / Indicators: Sore Pain Intervention(s): Premedicated before session;Monitored during session;Ice applied;Repositioned    Home Living Family/patient expects to be discharged to:: Private residence Living Arrangements: Alone Available Help at Discharge: Family;Available 24 hours/day Type of Home: House Home Access: Stairs to enter Entrance Stairs-Rails: Right Entrance Stairs-Number of Steps: 4 Home Layout: Two level;Able to live on main level with bedroom/bathroom Home Equipment: Gilford Rile -  2 wheels;Cane - quad;Cane - single point;Shower seat - built in      Prior Function Level of Independence: Independent         Comments: Ind amb community  distances, no fall history, Ind with ADLs     Hand Dominance        Extremity/Trunk Assessment   Upper Extremity Assessment Upper Extremity Assessment: Overall WFL for tasks assessed    Lower Extremity Assessment Lower Extremity Assessment: Generalized weakness;RLE deficits/detail RLE Deficits / Details: R hip flex strength >/= 3/5 RLE: Unable to fully assess due to pain       Communication   Communication: No difficulties  Cognition Arousal/Alertness: Awake/alert Behavior During Therapy: WFL for tasks assessed/performed Overall Cognitive Status: Within Functional Limits for tasks assessed                                        General Comments      Exercises Total Joint Exercises Ankle Circles/Pumps: AROM;Strengthening;Both;10 reps Quad Sets: AROM;Strengthening;Right;5 reps;10 reps Straight Leg Raises: AROM;Strengthening;Right;5 reps Long Arc Quad: AROM;Strengthening;Right;10 reps;15 reps Knee Flexion: AROM;Strengthening;Right;10 reps;15 reps Goniometric ROM: R knee AROM: 4-81 deg Marching in Standing: AROM;Strengthening;Both;5 reps;Standing Other Exercises Other Exercises: HEP education per handout Other Exercises: Positioning education to promote R knee PROM and prevent heel pressure   Assessment/Plan    PT Assessment Patient needs continued PT services  PT Problem List Decreased strength;Decreased range of motion;Decreased activity tolerance;Decreased balance;Decreased mobility;Decreased knowledge of use of DME;Pain       PT Treatment Interventions DME instruction;Gait training;Stair training;Functional mobility training;Therapeutic activities;Therapeutic exercise;Balance training;Patient/family education    PT Goals (Current goals can be found in the Care Plan section)  Acute Rehab PT Goals Patient Stated Goal: To keep up with the grandchildren PT Goal Formulation: With patient Time For Goal Achievement: 07/12/21 Potential to Achieve  Goals: Good    Frequency BID   Barriers to discharge        Co-evaluation               AM-PAC PT "6 Clicks" Mobility  Outcome Measure Help needed turning from your back to your side while in a flat bed without using bedrails?: A Little Help needed moving from lying on your back to sitting on the side of a flat bed without using bedrails?: A Little Help needed moving to and from a bed to a chair (including a wheelchair)?: A Little Help needed standing up from a chair using your arms (e.g., wheelchair or bedside chair)?: A Little Help needed to walk in hospital room?: A Little Help needed climbing 3-5 steps with a railing? : A Little 6 Click Score: 18    End of Session Equipment Utilized During Treatment: Gait belt Activity Tolerance: Patient tolerated treatment well Patient left: in chair;with family/visitor present;with chair alarm set;Other (comment) (OT preparing to enter for OT evaluation) Nurse Communication: Mobility status PT Visit Diagnosis: Other abnormalities of gait and mobility (R26.89);Muscle weakness (generalized) (M62.81);Pain Pain - Right/Left: Right Pain - part of body: Knee    Time: [id number] PT Time Calculation (min) (ACUTE ONLY): 56 min   Charges:   PT Evaluation $PT Eval Moderate Complexity: 1 Mod PT Treatments $Gait Training: 8-22 mins $Therapeutic Exercise: 8-22 mins       D. Royetta Asal PT, DPT 06/29/21, 12:48 PM

## 2021-06-29 NOTE — Plan of Care (Signed)

## 2021-06-29 NOTE — Evaluation (Signed)
Occupational Therapy Evaluation Patient Details Name: Kayla Zamora MRN: [id number] DOB: Mar 23, 1946 Today's Date: 06/29/2021   History of Present Illness Pt is a 75 yo F diagnosed with degenerative arthrosis of the right knee and is s/p elective R TKA. PMH includes: COPD and hypothyroidism.   Clinical Impression   Patient presenting with decreased Ind in self care, balance, functional mobility/transfers, endurance, and safety awareness. Patient reports living at home alone and independently PTA. She has great family support and they will be able to provide assist/supervision as needed at discharge. OT educated and demonstrated use of polar care and LB self care education in order to increase Ind with LB dressing. Paper handout provided as well. Patient will benefit from acute OT to increase overall independence in the areas of ADLs, functional mobility,and safety awareness in order to safely discharge home with family support.     Recommendations for follow up therapy are one component of a multi-disciplinary discharge planning process, led by the attending physician.  Recommendations may be updated based on patient status, additional functional criteria and insurance authorization.   Follow Up Recommendations  No OT follow up;Supervision - Intermittent    Equipment Recommendations  None recommended by OT       Precautions / Restrictions Precautions Precautions: Fall Restrictions Weight Bearing Restrictions: Yes RLE Weight Bearing: Weight bearing as tolerated Other Position/Activity Restrictions: No KI required, pt able to perform Ind RLE SLR without extensor lag      Mobility Bed Mobility Overal bed mobility: Modified Independent             General bed mobility comments: seated in recliner chair    Transfers Overall transfer level: Needs assistance Equipment used: Rolling walker (2 wheeled) Transfers: Sit to/from Stand Sit to Stand: Min guard         General  transfer comment: Mod verbal and visual cues for sequencing    Balance Overall balance assessment: Needs assistance   Sitting balance-Leahy Scale: Normal     Standing balance support: Bilateral upper extremity supported;During functional activity Standing balance-Leahy Scale: Good Standing balance comment: Min lean on the RW for support                           ADL either performed or assessed with clinical judgement   ADL Overall ADL's : Needs assistance/impaired                                       General ADL Comments: min A for LB self care secondary to decreased ROM     Vision Patient Visual Report: No change from baseline              Pertinent Vitals/Pain Pain Assessment: 0-10 Pain Score: 2  Pain Location: R knee Pain Descriptors / Indicators: Sore Pain Intervention(s): Monitored during session;Premedicated before session;Repositioned;Ice applied     Hand Dominance Right   Extremity/Trunk Assessment Upper Extremity Assessment Upper Extremity Assessment: Overall WFL for tasks assessed   Lower Extremity Assessment Lower Extremity Assessment: Defer to PT evaluation RLE Deficits / Details: R hip flex strength >/= 3/5 RLE: Unable to fully assess due to pain       Communication Communication Communication: No difficulties   Cognition Arousal/Alertness: Awake/alert Behavior During Therapy: WFL for tasks assessed/performed Overall Cognitive Status: Within Functional Limits for tasks assessed  Exercises Total Joint Exercises Ankle Circles/Pumps: AROM;Strengthening;Both;10 reps Quad Sets: AROM;Strengthening;Right;5 reps;10 reps Straight Leg Raises: AROM;Strengthening;Right;5 reps Long Arc Quad: AROM;Strengthening;Right;10 reps;15 reps Knee Flexion: AROM;Strengthening;Right;10 reps;15 reps Goniometric ROM: R knee AROM: 4-81 deg Marching in Standing: AROM;Strengthening;Both;5  reps;Standing Other Exercises Other Exercises: HEP education per handout Other Exercises: Positioning education to promote R knee PROM and prevent heel pressure        Home Living Family/patient expects to be discharged to:: Private residence Living Arrangements: Alone Available Help at Discharge: Family;Available 24 hours/day Type of Home: House Home Access: Stairs to enter [employer] of Steps: 4 Entrance Stairs-Rails: Right Home Layout: Two level;Able to live on main level with bedroom/bathroom     Bathroom Shower/Tub: Tub/shower unit   Bathroom Toilet: Handicapped height     Home Equipment: Environmental consultant - 2 wheels;Cane - quad;Cane - single point;Shower seat - built in          Prior Functioning/Environment Level of Independence: Independent        Comments: Ind amb community distances, no fall history, Ind with ADLs        OT Problem List: Decreased strength;Decreased range of motion;Decreased activity tolerance;Impaired balance (sitting and/or standing);Decreased knowledge of precautions;Decreased knowledge of use of DME or AE;Decreased safety awareness;Pain      OT Treatment/Interventions: Self-care/ADL training;Therapeutic exercise;Therapeutic activities;Energy conservation;DME and/or AE instruction;Patient/family education;Balance training    OT Goals(Current goals can be found in the care plan section) Acute Rehab OT Goals Patient Stated Goal: To keep up with the grandchildren and get home OT Goal Formulation: With patient Time For Goal Achievement: 07/13/21 Potential to Achieve Goals: Good ADL Goals Pt Will Perform Grooming: with modified independence;standing Pt Will Perform Lower Body Dressing: with modified independence;sit to/from stand Pt Will Transfer to Toilet: with modified independence;ambulating Pt Will Perform Toileting - Clothing Manipulation and hygiene: with modified independence;sit to/from stand  OT Frequency: Min 2X/week    Barriers to D/C:    none known at this time          AM-PAC OT "6 Clicks" Daily Activity     Outcome Measure Help from another person eating meals?: None Help from another person taking care of personal grooming?: None Help from another person toileting, which includes using toliet, bedpan, or urinal?: A Little Help from another person bathing (including washing, rinsing, drying)?: A Little Help from another person to put on and taking off regular upper body clothing?: None Help from another person to put on and taking off regular lower body clothing?: A Little 6 Click Score: 21   End of Session Equipment Utilized During Treatment: Rolling walker Nurse Communication: Mobility status  Activity Tolerance: Patient tolerated treatment well Patient left: in chair;with call bell/phone within reach;with chair alarm set;with family/visitor present  OT Visit Diagnosis: Unsteadiness on feet (R26.81);Muscle weakness (generalized) (M62.81)                Time: [id number] OT Time Calculation (min): 20 min Charges:  OT General Charges $OT Visit: 1 Visit OT Evaluation $OT Eval Low Complexity: 1 Low OT Treatments $Self Care/Home Management : 8-22 mins  Darleen Crocker, MS, OTR/L , CBIS ascom [phone]  06/29/21, 1:40 PM

## 2021-06-29 NOTE — TOC Progression Note (Signed)
Transition of Care [facility]) - Progression Note    Patient Details  Name: Kayla Zamora MRN: [id number] Date of Birth: [birth year]  [facility] CM/SW Contact  Izola Price, RN Phone Number: 06/29/2021, 9:56 AM  Clinical Narrative:   Called patient 's room to discuss discharge planning but PT was in progress in evaluating patient. Will call back and monitor for PT recommendations. Listed with [facility] for HH.  Simmie Davies RN CM          Expected Discharge Plan and Services                                                 Social Determinants of Health (SDOH) Interventions    Readmission Risk Interventions No flowsheet data found.

## 2021-06-29 NOTE — Progress Notes (Signed)
  Subjective: 1 Day Post-Op Procedure(s) (LRB): COMPUTER ASSISTED TOTAL KNEE ARTHROPLASTY - RNFA (Right) Patient reports pain as mild.   Patient is well, and has had no acute complaints or problems Plan is to go Home after hospital stay. Negative for chest pain and shortness of breath Fever: no Gastrointestinal: Negative for nausea and vomiting  Objective: Vital signs in last 24 hours: Temp:  [98.2 F (36.8 C)-99 F (37.2 C)] 98.6 F (37 C) (09/17 0304) Pulse Rate:  [73-96] 73 (09/17 0304) Resp:  [14-20] 20 (09/17 0304) BP: (92-125)/(45-78) 95/47 (09/17 0304) SpO2:  [92 %-97 %] 95 % (09/17 0304) Weight:  [80.7 kg] 80.7 kg (09/16 0958)  Intake/Output from previous day:  Intake/Output Summary (Last 24 hours) at 06/29/2021 0706 Last data filed at 06/29/2021 0602 Gross per 24 hour  Intake 2716.96 ml  Output 965 ml  Net 1751.96 ml    Intake/Output this shift: No intake/output data recorded.  Labs: No results for input(s): HGB in the last 72 hours. No results for input(s): WBC, RBC, HCT, PLT in the last 72 hours. No results for input(s): NA, K, CL, CO2, BUN, CREATININE, GLUCOSE, CALCIUM in the last 72 hours. No results for input(s): LABPT, INR in the last 72 hours.   EXAM General - Patient is Alert and Oriented Extremity - Neurovascular intact Sensation intact distally Dorsiflexion/Plantar flexion intact Compartment soft Dressing/Incision - clean, dry, with the Hemovac intact Motor Function - intact, moving foot and toes well on exam.   Past Medical History:  Diagnosis Date   Arthritis    Atrial fibrillation (HCC)    Cataracts, bilateral    Chronic anticoagulation    Apixaban   COPD (chronic obstructive pulmonary disease) (HCC)    Depression    Dyshidrotic eczema    Dysplastic nevus 12/21/2019   a.) L lat waistline - moderate. b.) L ant waistline near groin - moderate   History of 2019 novel coronavirus disease (COVID-19) 10/21/2020   HLD (hyperlipidemia)     Hypothyroidism    Multiple thyroid nodules    Pneumonia 2017   Sleep difficulties    Takes melatonin   SVT (supraventricular tachycardia) (HCC)     Assessment/Plan: 1 Day Post-Op Procedure(s) (LRB): COMPUTER ASSISTED TOTAL KNEE ARTHROPLASTY - RNFA (Right) Active Problems:   Total knee replacement status  Estimated body mass index is 30.55 kg/m as calculated from the following:   Height as of this encounter: '5\' 4"'$  (1.626 m).   Weight as of this encounter: 80.7 kg. Advance diet Up with therapy D/C IV fluids Plan for discharge tomorrow  DVT Prophylaxis - Foot Pumps, TED hose, and Eliquis Weight-Bearing as tolerated to right leg  Reche Dixon, PA-C Orthopaedic Surgery 06/29/2021, 7:06 AM

## 2021-06-29 NOTE — Progress Notes (Signed)
Physical Therapy Treatment Patient Details Name: Kayla Zamora MRN: [id number] DOB: 1945-10-31 Today's Date: 06/29/2021   History of Present Illness Pt is a 75 yo F diagnosed with degenerative arthrosis of the right knee and is s/p elective R TKA. PMH includes: COPD and hypothyroidism.    PT Comments    Stood and completed lap around unit with RW and supervision/min guard.  Good safety.  RN in room and stated pt would not be discharging today.  She elected to wait until tomorrow for stair review.  Verbal review of car transfers.  Overall progressing well.  Polar care with connection issues and leaking water.  RN notified and will likely need new system.   Recommendations for follow up therapy are one component of a multi-disciplinary discharge planning process, led by the attending physician.  Recommendations may be updated based on patient status, additional functional criteria and insurance authorization.  Follow Up Recommendations  Home health PT     Equipment Recommendations  None recommended by PT    Recommendations for Other Services       Precautions / Restrictions Precautions Precautions: Fall Restrictions Weight Bearing Restrictions: Yes RLE Weight Bearing: Weight bearing as tolerated Other Position/Activity Restrictions: No KI required, pt able to perform Ind RLE SLR without extensor lag     Mobility  Bed Mobility Overal bed mobility: Modified Independent             General bed mobility comments: returned to bed with supervsion    Transfers Overall transfer level: Needs assistance Equipment used: Rolling walker (2 wheeled) Transfers: Sit to/from Stand Sit to Stand: Min guard         General transfer comment: Mod verbal and visual cues for sequencing  Ambulation/Gait Ambulation/Gait assistance: Min guard Gait Distance (Feet): 200 Feet Assistive device: Rolling walker (2 wheeled) Gait Pattern/deviations: Step-through pattern;Decreased step  length - right;Decreased step length - left;Antalgic Gait velocity: decreased   General Gait Details: Mildy antalgic on RLE but step-through pattern with good stability   Stairs Stairs: Yes Stairs assistance: Min guard Stair Management: One rail Right;Forwards;Step to pattern Number of Stairs: 4 General stair comments: Pt able to ascend and descend 4 steps with one rail x 2 with daughter present for training; min to mod verbal and visual cues for sequencing with pt able to demonstrate very good eccentric and concentric control and stability   Wheelchair Mobility    Modified Rankin (Stroke Patients Only)       Balance Overall balance assessment: Needs assistance Sitting-balance support: Feet supported Sitting balance-Leahy Scale: Normal     Standing balance support: Bilateral upper extremity supported;During functional activity Standing balance-Leahy Scale: Good Standing balance comment: Min lean on the RW for support                            Cognition Arousal/Alertness: Awake/alert Behavior During Therapy: WFL for tasks assessed/performed Overall Cognitive Status: Within Functional Limits for tasks assessed                                        Exercises Total Joint Exercises Ankle Circles/Pumps: AROM;Strengthening;Both;10 reps Quad Sets: AROM;Strengthening;Right;5 reps;10 reps Straight Leg Raises: AROM;Strengthening;Right;5 reps Long Arc Quad: AROM;Strengthening;Right;10 reps;15 reps Knee Flexion: AROM;Strengthening;Right;10 reps;15 reps Goniometric ROM: R knee AROM: 4-81 deg Marching in Standing: AROM;Strengthening;Both;5 reps;Standing Other Exercises Other Exercises: HEP education per handout  Other Exercises: Positioning education to promote R knee PROM and prevent heel pressure    General Comments        Pertinent Vitals/Pain Pain Assessment: Faces Pain Score: 2  Faces Pain Scale: Hurts little more Pain Location: R  knee Pain Descriptors / Indicators: Sore Pain Intervention(s): Limited activity within patient's tolerance;Monitored during session;Repositioned    Home Living Family/patient expects to be discharged to:: Private residence Living Arrangements: Alone Available Help at Discharge: Family;Available 24 hours/day Type of Home: House Home Access: Stairs to enter Entrance Stairs-Rails: Right Home Layout: Two level;Able to live on main level with bedroom/bathroom Home Equipment: Gilford Rile - 2 wheels;Cane - quad;Cane - single point;Shower seat - built in      Prior Function Level of Independence: Independent      Comments: Ind amb community distances, no fall history, Ind with ADLs   PT Goals (current goals can now be found in the care plan section) Acute Rehab PT Goals Patient Stated Goal: To keep up with the grandchildren and get home PT Goal Formulation: With patient Time For Goal Achievement: 07/12/21 Potential to Achieve Goals: Good Progress towards PT goals: Progressing toward goals    Frequency    BID      PT Plan Current plan remains appropriate    Co-evaluation              AM-PAC PT "6 Clicks" Mobility   Outcome Measure  Help needed turning from your back to your side while in a flat bed without using bedrails?: A Little Help needed moving from lying on your back to sitting on the side of a flat bed without using bedrails?: A Little Help needed moving to and from a bed to a chair (including a wheelchair)?: A Little Help needed standing up from a chair using your arms (e.g., wheelchair or bedside chair)?: A Little Help needed to walk in hospital room?: A Little Help needed climbing 3-5 steps with a railing? : A Little 6 Click Score: 18    End of Session Equipment Utilized During Treatment: Gait belt Activity Tolerance: Patient tolerated treatment well Patient left: in bed;with call bell/phone within reach;with bed alarm set Nurse Communication: Mobility  status PT Visit Diagnosis: Other abnormalities of gait and mobility (R26.89);Muscle weakness (generalized) (M62.81);Pain Pain - Right/Left: Right Pain - part of body: Knee     Time: 0100-0111 PT Time Calculation (min) (ACUTE ONLY): 11 min  Charges:  $Gait Training: 8-22 mins $Therapeutic Exercise: 8-22 mins                    Chesley Noon, PTA 06/29/21, 2:04 PM

## 2021-06-30 DIAGNOSIS — M1711 Unilateral primary osteoarthritis, right knee: Secondary | ICD-10-CM | POA: Diagnosis not present

## 2021-06-30 NOTE — Progress Notes (Addendum)
  Subjective: 2 Days Post-Op Procedure(s) (LRB): COMPUTER ASSISTED TOTAL KNEE ARTHROPLASTY - RNFA (Right) Patient reports pain as mild.   Patient is well, and has had no acute complaints or problems Plan is to go Home after hospital stay. Negative for chest pain and shortness of breath Fever: no Gastrointestinal: Negative for nausea and vomiting  Objective: Vital signs in last 24 hours: Temp:  [97.5 F (36.4 C)-98.7 F (37.1 C)] 97.7 F (36.5 C) (09/18 0422) Pulse Rate:  [66-74] 66 (09/18 0422) Resp:  [16-18] 17 (09/17 2010) BP: (89-106)/(47-57) 106/57 (09/18 0422) SpO2:  [94 %-96 %] 96 % (09/18 0422)  Intake/Output from previous day:  Intake/Output Summary (Last 24 hours) at 06/30/2021 0645 Last data filed at 06/29/2021 1801 Gross per 24 hour  Intake 0 ml  Output 150 ml  Net -150 ml    Intake/Output this shift: No intake/output data recorded.  Labs: No results for input(s): HGB in the last 72 hours. No results for input(s): WBC, RBC, HCT, PLT in the last 72 hours. No results for input(s): NA, K, CL, CO2, BUN, CREATININE, GLUCOSE, CALCIUM in the last 72 hours. No results for input(s): LABPT, INR in the last 72 hours.   EXAM General - Patient is Alert and Oriented Extremity - Neurovascular intact Sensation intact distally Dorsiflexion/Plantar flexion intact Compartment soft Dressing/Incision - clean, dry, with the Hemovac removed with no complication.  The Hemovac tubing was intact on removal. Motor Function - intact, moving foot and toes well on exam.   Past Medical History:  Diagnosis Date   Arthritis    Atrial fibrillation (HCC)    Cataracts, bilateral    Chronic anticoagulation    Apixaban   COPD (chronic obstructive pulmonary disease) (HCC)    Depression    Dyshidrotic eczema    Dysplastic nevus 12/21/2019   a.) L lat waistline - moderate. b.) L ant waistline near groin - moderate   History of 2019 novel coronavirus disease (COVID-19) 10/21/2020   HLD  (hyperlipidemia)    Hypothyroidism    Multiple thyroid nodules    Pneumonia 2017   Sleep difficulties    Takes melatonin   SVT (supraventricular tachycardia) (HCC)     Assessment/Plan: 2 Days Post-Op Procedure(s) (LRB): COMPUTER ASSISTED TOTAL KNEE ARTHROPLASTY - RNFA (Right) Active Problems:   Total knee replacement status  Estimated body mass index is 30.55 kg/m as calculated from the following:   Height as of this encounter: '5\' 4"'$  (1.626 m).   Weight as of this encounter: 80.7 kg. Continue Physical Therapy  Discharge home today after physical therapy.  DVT Prophylaxis - Foot Pumps, TED hose, and Eliquis Weight-Bearing as tolerated to right leg  Reche Dixon, PA-C Orthopaedic Surgery 06/30/2021, 6:45 AM

## 2021-06-30 NOTE — Progress Notes (Signed)
VSS, IV cath removed site c/d/I discussed d/c packet with pt and daughter at the bedside. Both verbalizing understanding, prescriptions placed in packet pt went home with two honey combs, polar care and ted hose placed. Pt transport via w/c down to private car.

## 2021-06-30 NOTE — TOC Transition Note (Signed)
Transition of Care [facility]) - CM/SW Discharge Note   Patient Details  Name: Kayla Zamora MRN: [id number] Date of Birth: December 27, 1945  Transition [facility]) CM/SW Contact:  Izola Price, RN Phone Number: 06/30/2021, 12:18 PM   Clinical Narrative:  Patient to be discharged today. HH in place with [facility]. No DME needed ordered. Discussed DC plan with patient and she is actively engaged and had no concerns or questions regarding discharge plan. Good support system in place, no transportation issues during recovery, one daughter lives next door. [facility] notified of DC today and information given to patient. Daughter will transport home. Simmie Davies RN CM     Final next level of care: Home w Home Health Services Barriers to Discharge: Barriers Resolved   Patient Goals and CMS Choice        Discharge Placement                       Discharge Plan and Services In-house Referral:  [facility]) Discharge Planning Services: CM Consult Post Acute Care Choice: NA          DME Arranged: N/A (Patient has equipment needed at home.) DME Agency: NA       HH Arranged: PT [place] Agency: [place] Date [place]: 06/30/21 Time [place]: 1206 Representative spoke with at [place]: [place]  Social Determinants of Health ([place]) Interventions     Readmission Risk Interventions No flowsheet data found.

## 2021-06-30 NOTE — TOC Initial Note (Addendum)
Transition of Care [facility]) - Initial/Assessment Note    Patient Details  Name: Tierny Labranche MRN: [id number] Date of Birth: 1946-10-05  Transition of Care [facility]) CM/SW Contact:    Izola Price, RN Phone Number: 06/30/2021, 12:07 PM  Clinical Narrative:  Patient has a scheduled admission for TKA. Independent pta. Patient lives alone with daughter that lives next door and other daughters that live in the county. She is widowed. She has no issues with transportation and daughters can assist when needed during recovery. House had been retrofitted for disable/ill parent so many safety mechanisms in place per engaged patient. Has all DME needed including a shower chair, raised toilet seat. Feels she has a very good support system in place. She has no issues obtaining medications or paying for medications.  PCP, RX and home address, contact information confirmed with patient as listed in EHR. Will discharge home with [facility].  Simmie Davies RN CM               [place],[phone] [facility] - [place], [place] PCP: Levy Sjogren RN CM  Expected Discharge Plan: Home w Home Health Services Barriers to Discharge: Barriers Resolved   Patient Goals and CMS Choice        Expected Discharge Plan and Services Expected Discharge Plan: [place] In-house Referral:  [facility]) Discharge Planning Services: CM Consult Post Acute Care Choice: NA Living arrangements for the past 2 months: Single Family Home Expected Discharge Date: 06/30/21               DME Arranged: N/A (Patient has equipment needed at home.) DME Agency: NA       HH Arranged: PT [place] Agency: [place] Date [place]: 06/30/21 Time [place]: 1206 Representative spoke with at [place]: [place]  Prior Living Arrangements/Services Living arrangements for the past 2 months: [place] with:: Self Patient language and need for  interpreter reviewed:: No Do you feel safe going back to the place where you live?: Yes      Need for Family Participation in Patient Care: Yes (Comment) Care giver support system in place?: Yes (comment)   Criminal Activity/Legal Involvement Pertinent to Current Situation/Hospitalization: No - Comment as needed  Activities of Daily Living Home Assistive Devices/Equipment: Eyeglasses, Raised toilet seat with rails, Shower chair without back ADL Screening (condition at time of admission) Patient's cognitive ability adequate to safely complete daily activities?: Yes Is the patient deaf or have difficulty hearing?: No Does the patient have difficulty seeing, even when wearing glasses/contacts?: No Does the patient have difficulty concentrating, remembering, or making decisions?: No Patient able to express need for assistance with ADLs?: Yes Does the patient have difficulty dressing or bathing?: No Independently performs ADLs?: Yes (appropriate for developmental age) Does the patient have difficulty walking or climbing stairs?: Yes Weakness of Legs: Right Weakness of Arms/Hands: None  Permission Sought/Granted Permission sought to share information with : Case Manager, Family Supports Permission granted to share information with : Yes, Verbal Permission Granted              Emotional Assessment Appearance:: Appears stated age Attitude/Demeanor/Rapport: Engaged Affect (typically observed): Accepting, Calm, Adaptable Orientation: : Oriented to Self, Oriented to Place, Oriented to  Time, Oriented to Situation Alcohol / Substance Use: Not Applicable Psych Involvement: No (comment)  Admission diagnosis:  Total knee replacement status [Z96.659] Patient Active Problem List   Diagnosis Date Noted  Total knee replacement status 06/28/2021   HLD (hyperlipidemia) 03/01/2021   Acquired nasolacrimal duct obstruction, 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/2020   Chronic dacryocystitis, 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    Epiphora due to insufficient drainage of both sides 11/08/2020   Toxic conjunctivitis, bilateral 03/06/2020   Arthritis of right knee 08/30/2018   Multiple thyroid nodules 08/30/2018   Urge urinary incontinence 08/30/2018   Bereavement due to life event 12/17/2016   COPD exacerbation ([place]) 12/15/2016   Elevated troponin 12/15/2016   SVT (supraventricular tachycardia) ([place]) 12/15/2016   Pneumonia 12/14/2016   Acute respiratory insufficiency 12/13/2016   Left lower lobe pneumonia 12/13/2016   Chest pain 12/13/2016   Persistent cough 12/13/2016   Hypothyroidism 11/29/2014   Acute bronchitis due to infection 10/21/2014   MMT (medial meniscus tear) 09/01/2013   PCP:  Chelsea Primus, MD Pharmacy:   [facility] [place] [place] - [place] [address] [place] [address] Phone: [phone] Fax: [phone]     Social Determinants of Health (SDOH) Interventions    Readmission Risk Interventions No flowsheet data found.

## 2021-06-30 NOTE — Discharge Summary (Signed)
Physician Discharge Summary  Subjective: 2 Days Post-Op Procedure(s) (LRB): COMPUTER ASSISTED TOTAL KNEE ARTHROPLASTY - RNFA (Right) Patient reports pain as moderate.   Patient seen in rounds with Dr. Posey Pronto. Patient is well, and has had no acute complaints or problems Patient is ready to go home with home health physical therapy.  Physician Discharge Summary  Patient ID: Kayla Zamora MRN: [id number] DOB/AGE: 74-10-1945 74 y.o.  Admit date: 06/28/2021 Discharge date: 06/30/2021  Admission Diagnoses:  Discharge Diagnoses:  Active Problems:   Total knee replacement status   Discharged Condition: good  Hospital Course: Patient is postop day 2 from a right total knee replacement.  She is doing very well since surgery.  She ambulated around the nurses station yesterday.  She was able to do a straight leg raise independently yesterday.  She is ambulated to the bathroom with minimal assistance.  The patient has had stable vitals.  She is ready go home with home health physical therapy.  Treatments: surgery:   Right total knee arthroplasty using computer-assisted navigation   SURGEON:  Marciano Sequin. M.D.   ANESTHESIA: spinal and general   ESTIMATED BLOOD LOSS: 75 mL   FLUIDS REPLACED: 1000 mL of crystalloid   TOURNIQUET TIME: 86 minutes   DRAINS: 2 medium Hemovac drains   SOFT TISSUE RELEASES: Anterior cruciate ligament, posterior cruciate ligament, deep  medial collateral ligament, patellofemoral ligament   IMPLANTS UTILIZED: DePuy Attune size 5N posterior stabilized femoral component (cemented), size 5 rotating platform tibial component (cemented), 35 mm medialized dome patella (cemented), and a 5 mm stabilized rotating platform polyethylene insert.  Discharge Exam: Blood pressure (!) 106/57, pulse 66, temperature 97.7 F (36.5 C), temperature source Oral, resp. rate 17, height '5\' 4"'$  (1.626 m), weight 80.7 kg, SpO2 96 %.   Disposition: Discharge disposition: 01-Home  or Self Care        Allergies as of 06/30/2021       Reactions   Levofloxacin Other (See Comments)   Jittery   Penicillins Swelling, Rash   IgE = 48 (WNL) on 06/18/2021 Has patient had a PCN reaction causing immediate rash, facial/tongue/throat swelling, SOB or lightheadedness with hypotension: No Has patient had a PCN reaction causing severe rash involving mucus membranes or skin necrosis: No Has patient had a PCN reaction that required hospitalization No Has patient had a PCN reaction occurring within the last 10 years: No If all of the above answers are "NO", then may proceed with Cephalosporin use.   Sulfa Antibiotics Rash        Medication List     TAKE these medications    acetaminophen 650 MG CR tablet Commonly known as: TYLENOL Take 650 mg by mouth every 8 (eight) hours as needed for pain.   apixaban 5 MG Tabs tablet Commonly known as: ELIQUIS Take 5 mg by mouth 2 (two) times daily.   benzonatate 200 MG capsule Commonly known as: TESSALON Take 1 capsule (200 mg total) by mouth 3 (three) times daily as needed for cough.   Biotin w/ Vitamins C & E 1250-7.5-7.5 MCG-MG-UNT Chew Chew 1 Dose by mouth daily.   celecoxib 200 MG capsule Commonly known as: CELEBREX Take 1 capsule (200 mg total) by mouth 2 (two) times daily.   citalopram 10 MG tablet Commonly known as: CELEXA Take 10 mg by mouth daily.   diclofenac Sodium 1 % Gel Commonly known as: VOLTAREN Apply 1 application topically daily as needed (pain).   Melatonin 2.5 MG Chew Chew 1 Dose  by mouth daily as needed (insomnia).   metoprolol succinate 25 MG 24 hr tablet Commonly known as: TOPROL-XL Take 12.5 mg by mouth at bedtime.   oxyCODONE 5 MG immediate release tablet Commonly known as: Oxy IR/ROXICODONE Take 1 tablet (5 mg total) by mouth every 4 (four) hours as needed for moderate pain (pain score 4-6).   simvastatin 20 MG tablet Commonly known as: ZOCOR Take 20 mg by mouth daily.   traMADol  50 MG tablet Commonly known as: ULTRAM Take 1-2 tablets (50-100 mg total) by mouth every 4 (four) hours as needed for moderate pain.               Durable Medical Equipment  (From admission, onward)           Start     Ordered   06/28/21 1720  DME Walker rolling  Once       Question:  Patient needs a walker to treat with the following condition  Answer:  Total knee replacement status   06/28/21 1720   06/28/21 1720  DME Bedside commode  Once       Question:  Patient needs a bedside commode to treat with the following condition  Answer:  Total knee replacement status   06/28/21 1720            Follow-up Information     Duanne Guess, PA-C Follow up on 07/12/2021.   Specialties: Orthopedic Surgery, Emergency Medicine Why: at 10:45am Contact information: [place] [address] [phone]         Dereck Leep, MD Follow up on 08/08/2021.   Specialty: Orthopedic Surgery Why: at 2:45pm Contact information: [place] [address] [phone]                 Signed: Prescott Parma, Korianna Washer 06/30/2021, 6:48 AM   Objective: Vital signs in last 24 hours: Temp:  [97.5 F (36.4 C)-98.7 F (37.1 C)] 97.7 F (36.5 C) (09/18 0422) Pulse Rate:  [66-74] 66 (09/18 0422) Resp:  [16-18] 17 (09/17 2010) BP: (89-106)/(47-57) 106/57 (09/18 0422) SpO2:  [94 %-96 %] 96 % (09/18 0422)  Intake/Output from previous day:  Intake/Output Summary (Last 24 hours) at 06/30/2021 0648 Last data filed at 06/29/2021 1801 Gross per 24 hour  Intake 0 ml  Output 150 ml  Net -150 ml    Intake/Output this shift: No intake/output data recorded.  Labs: No results for input(s): HGB in the last 72 hours. No results for input(s): WBC, RBC, HCT, PLT in the last 72 hours. No results for input(s): NA, K, CL, CO2, BUN, CREATININE, GLUCOSE, CALCIUM in the last 72 hours. No results for input(s): LABPT, INR in the last 72  hours.  EXAM: General - Patient is Alert and Oriented Extremity - Neurovascular intact Sensation intact distally Dorsiflexion/Plantar flexion intact Compartment soft Incision - clean, dry, with a Hemovac removed with no complication. Motor Function -dorsiflexion and plantarflexion are intact.  Able to do a straight leg raise independently.  Assessment/Plan: 2 Days Post-Op Procedure(s) (LRB): COMPUTER ASSISTED TOTAL KNEE ARTHROPLASTY - RNFA (Right) Procedure(s) (LRB): COMPUTER ASSISTED TOTAL KNEE ARTHROPLASTY - RNFA (Right) Past Medical History:  Diagnosis Date   Arthritis    Atrial fibrillation (HCC)    Cataracts, bilateral    Chronic anticoagulation    Apixaban   COPD (chronic obstructive pulmonary disease) ([place])    Depression    Dyshidrotic eczema    Dysplastic nevus 12/21/2019  a.) L lat waistline - moderate. b.) L ant waistline near groin - moderate   History of 2019 novel coronavirus disease (COVID-19) 10/21/2020   HLD (hyperlipidemia)    Hypothyroidism    Multiple thyroid nodules    Pneumonia 2017   Sleep difficulties    Takes melatonin   SVT (supraventricular tachycardia) (HCC)    Active Problems:   Total knee replacement status  Estimated body mass index is 30.55 kg/m as calculated from the following:   Height as of this encounter: '5\' 4"'$  (1.626 m).   Weight as of this encounter: 80.7 kg. Up with therapy Discharge home with home health Diet - Regular diet Follow up - in 2 weeks Activity - WBAT Disposition - Home Condition Upon Discharge - Good DVT Prophylaxis - Foot Pumps, TED hose, and Eliquis  Reche Dixon, PA-C Orthopaedic Surgery 06/30/2021, 6:48 AM

## 2021-06-30 NOTE — Anesthesia Postprocedure Evaluation (Signed)
Anesthesia Post Note  Patient: Kayla Zamora  Procedure(s) Performed: COMPUTER ASSISTED TOTAL KNEE ARTHROPLASTY - RNFA (Right: Knee)  Patient location during evaluation: PACU Anesthesia Type: General Level of consciousness: awake and alert Pain management: pain level controlled Vital Signs Assessment: post-procedure vital signs reviewed and stable Respiratory status: spontaneous breathing, nonlabored ventilation, respiratory function stable and patient connected to nasal cannula oxygen Cardiovascular status: blood pressure returned to baseline and stable Postop Assessment: no apparent nausea or vomiting Anesthetic complications: no   No notable events documented.   Last Vitals:  Vitals:   06/30/21 1149 06/30/21 1158  BP:  (!) 107/47  Pulse:  71  Resp:  16  Temp:  36.6 C  SpO2: 99% 99%    Last Pain:  Vitals:   06/30/21 0839  TempSrc:   PainSc: 5                  Martha Clan

## 2021-06-30 NOTE — Progress Notes (Signed)
Physical Therapy Treatment Patient Details Name: Kayla Zamora MRN: [id number] DOB: 12/22/1945 Today's Date: 06/30/2021   History of Present Illness Pt is a 75 yo F diagnosed with degenerative arthrosis of the right knee and is s/p elective R TKA. PMH includes: COPD and hypothyroidism.    PT Comments    Assisted OOB to bathroom for large BM.  Returned to chair to await pain meds prior to continuing session.  Returned later for ex, gait around unit and stair training.  Questions answered.  She did have one LOB wth gait when she reached behind her to check gown closure and required min assist to recover and education provided - voiced understanding.   Recommendations for follow up therapy are one component of a multi-disciplinary discharge planning process, led by the attending physician.  Recommendations may be updated based on patient status, additional functional criteria and insurance authorization.  Follow Up Recommendations  Home health PT     Equipment Recommendations  None recommended by PT    Recommendations for Other Services       Precautions / Restrictions Precautions Precautions: Fall Restrictions Weight Bearing Restrictions: Yes RLE Weight Bearing: Weight bearing as tolerated Other Position/Activity Restrictions: No KI required, pt able to perform Ind RLE SLR without extensor lag     Mobility  Bed Mobility Overal bed mobility: Modified Independent                  Transfers Overall transfer level: Needs assistance Equipment used: Rolling walker (2 wheeled) Transfers: Sit to/from Stand Sit to Stand: Min guard            Ambulation/Gait Ambulation/Gait assistance: Min guard Gait Distance (Feet): 200 Feet Assistive device: Rolling walker (2 wheeled)   Gait velocity: decreased   General Gait Details: Mildy antalgic on RLE but step-through pattern with good stability   Stairs Stairs: Yes Stairs assistance: Min guard Stair Management: One  rail Right;Forwards;Step to pattern Number of Stairs: 4 General stair comments: cues for sequenicing review   Wheelchair Mobility    Modified Rankin (Stroke Patients Only)       Balance Overall balance assessment: Needs assistance Sitting-balance support: Feet supported Sitting balance-Leahy Scale: Normal     Standing balance support: Bilateral upper extremity supported;During functional activity Standing balance-Leahy Scale: Good Standing balance comment: Min lean on the RW for support - one LOB when she took hand off walker to check gown closure required min assist and education provided                            Cognition Arousal/Alertness: Awake/alert Behavior During Therapy: WFL for tasks assessed/performed Overall Cognitive Status: Within Functional Limits for tasks assessed                                        Exercises Total Joint Exercises Ankle Circles/Pumps: AROM;Strengthening;Both;10 reps Quad Sets: AROM;Strengthening;Right;5 reps;10 reps Straight Leg Raises: AROM;Strengthening;Right;5 reps Long Arc Quad: AROM;Strengthening;Right;10 reps;15 reps Knee Flexion: AROM;Strengthening;Right;10 reps;15 reps Goniometric ROM: 3-83 Marching in Standing: AROM;Strengthening;Both;5 reps;Standing    General Comments        Pertinent Vitals/Pain Pain Assessment: Faces Faces Pain Scale: Hurts little more Pain Location: R knee Pain Descriptors / Indicators: Sore Pain Intervention(s): Limited activity within patient's tolerance;Monitored during session;Repositioned    Home Living  Prior Function            PT Goals (current goals can now be found in the care plan section) Progress towards PT goals: Progressing toward goals    Frequency    BID      PT Plan Current plan remains appropriate    Co-evaluation              AM-PAC PT "6 Clicks" Mobility   Outcome Measure  Help needed turning  from your back to your side while in a flat bed without using bedrails?: None Help needed moving from lying on your back to sitting on the side of a flat bed without using bedrails?: None Help needed moving to and from a bed to a chair (including a wheelchair)?: A Little Help needed standing up from a chair using your arms (e.g., wheelchair or bedside chair)?: A Little Help needed to walk in hospital room?: A Little Help needed climbing 3-5 steps with a railing? : A Little 6 Click Score: 20    End of Session Equipment Utilized During Treatment: Gait belt Activity Tolerance: Patient tolerated treatment well Patient left: in chair;with call bell/phone within reach;with chair alarm set Nurse Communication: Mobility status PT Visit Diagnosis: Other abnormalities of gait and mobility (R26.89);Muscle weakness (generalized) (M62.81);Pain Pain - Right/Left: Right Pain - part of body: Knee     Time: [id number] PT Time Calculation (min) (ACUTE ONLY): 25 min  Charges:  $Gait Training: 8-22 mins $Therapeutic Exercise: 8-22 mins                    Chesley Noon, PTA 06/30/21, 10:28 AM

## 2021-08-22 ENCOUNTER — Ambulatory Visit: Payer: Medicare PPO | Admitting: Dermatology

## 2021-08-22 ENCOUNTER — Other Ambulatory Visit: Payer: Self-pay

## 2021-08-22 DIAGNOSIS — L820 Inflamed seborrheic keratosis: Secondary | ICD-10-CM

## 2021-08-22 DIAGNOSIS — L821 Other seborrheic keratosis: Secondary | ICD-10-CM

## 2021-08-22 DIAGNOSIS — L578 Other skin changes due to chronic exposure to nonionizing radiation: Secondary | ICD-10-CM | POA: Diagnosis not present

## 2021-08-22 NOTE — Progress Notes (Signed)
   Follow-Up Visit   Subjective  Kayla Zamora is a 75 y.o. female who presents for the following: Other (Spots on face started coming up about 3 weeks ago).  The following portions of the chart were reviewed this encounter and updated as appropriate:   Tobacco  Allergies  Meds  Problems  Med Hx  Surg Hx  Fam Hx     Review of Systems:  No other skin or systemic complaints except as noted in HPI or Assessment and Plan.  Objective  Well appearing patient in no apparent distress; mood and affect are within normal limits.  A focused examination was performed including face, chest. Relevant physical exam findings are noted in the Assessment and Plan.  Right face x 3, left cheek x 1 (4) Erythematous keratotic or waxy stuck-on papule or plaque.    Assessment & Plan   Actinic Damage - chronic, secondary to cumulative UV radiation exposure/sun exposure over time - diffuse scaly erythematous macules with underlying dyspigmentation - Recommend daily broad spectrum sunscreen SPF 30+ to sun-exposed areas, reapply every 2 hours as needed.  - Recommend staying in the shade or wearing long sleeves, sun glasses (UVA+UVB protection) and wide brim hats (4-inch brim around the entire circumference of the hat). - Call for new or changing lesions.  Seborrheic Keratoses - Stuck-on, waxy, tan-brown papules and/or plaques  - Benign-appearing - Discussed benign etiology and prognosis. - Observe - Call for any changes  Inflamed seborrheic keratosis Right face x 3, left cheek x 1  Destruction of lesion - Right face x 3, left cheek x 1 Complexity: simple   Destruction method: cryotherapy   Informed consent: discussed and consent obtained   Timeout:  patient name, date of birth, surgical site, and procedure verified Lesion destroyed using liquid nitrogen: Yes   Region frozen until ice ball extended beyond lesion: Yes   Outcome: patient tolerated procedure well with no complications    Post-procedure details: wound care instructions given    Return for Follow up as scheduled.  I, Ashok Cordia, CMA, am acting as scribe for Sarina Ser, MD . Documentation: I have reviewed the above documentation for accuracy and completeness, and I agree with the above.  Sarina Ser, MD

## 2021-08-22 NOTE — Patient Instructions (Signed)

## 2021-08-27 ENCOUNTER — Encounter: Payer: Self-pay | Admitting: Dermatology

## 2021-10-21 ENCOUNTER — Other Ambulatory Visit: Payer: Self-pay

## 2021-10-21 ENCOUNTER — Ambulatory Visit
Admission: EM | Admit: 2021-10-21 | Discharge: 2021-10-21 | Disposition: A | Discharge status: Home / Self Care | Payer: Medicare PPO | Attending: Emergency Medicine | Admitting: Emergency Medicine

## 2021-10-21 DIAGNOSIS — J029 Acute pharyngitis, unspecified: Secondary | ICD-10-CM | POA: Insufficient documentation

## 2021-10-21 DIAGNOSIS — B349 Viral infection, unspecified: Secondary | ICD-10-CM | POA: Diagnosis present

## 2021-10-21 DIAGNOSIS — B370 Candidal stomatitis: Secondary | ICD-10-CM | POA: Diagnosis present

## 2021-10-21 LAB — GROUP A STREP BY PCR: Group A Strep by PCR: NOT DETECTED

## 2021-10-21 MED ORDER — LIDOCAINE VISCOUS HCL 2 % MT SOLN: 15.0000 mL | OROMUCOSAL | 0 refills | Status: AC | PRN

## 2021-10-21 MED ORDER — NYSTATIN 100000 UNIT/ML MT SUSP: 500000.0000 [IU] | Freq: Four times a day (QID) | OROMUCOSAL | 0 refills | Status: AC

## 2021-10-21 NOTE — ED Provider Notes (Signed)
[facility]    CSN: [id number] Arrival date & time: 10/21/21  0086      History   Chief Complaint Chief Complaint  Patient presents with   Sore Throat    HPI Kayla Zamora is a 76 y.o. female.   Patient presents with sore throat for 6 days.  Feels as if congestion is sitting in the back of throat.  Woke up this morning with Chamaine Stankus coating to tongue, unable to remove with dental hygiene.  Has associated ear fullness, nonproductive cough, mild generalized headaches and body aches.  Possible sick contacts.  Has attempted use of Tessalon for coughing which has been effective.  History of atrial fibrillation, hyperlipidemia, hypothyroidism, pneumonia.  Denies COPD.  Non-smoker.  Home COVID test negative twice.  Past Medical History:  Diagnosis Date   Arthritis    Atrial fibrillation (HCC)    Cataracts, bilateral    Chronic anticoagulation    Apixaban   COPD (chronic obstructive pulmonary disease) (HCC)    Depression    Dyshidrotic eczema    Dysplastic nevus 12/21/2019   a.) L lat waistline - moderate. 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   Epiphora due to insufficient drainage of both sides 11/08/2020   Toxic conjunctivitis, bilateral 03/06/2020   Arthritis of right knee 08/30/2018   Multiple thyroid nodules 08/30/2018   Urge urinary incontinence 08/30/2018   Bereavement due to life event 12/17/2016   COPD exacerbation ([place]) 12/15/2016   Elevated troponin 12/15/2016   SVT  (supraventricular tachycardia) ([place]) 12/15/2016   Pneumonia 12/14/2016   Acute respiratory insufficiency 12/13/2016   Left lower lobe pneumonia 12/13/2016   Chest pain 12/13/2016   Persistent cough 12/13/2016   Hypothyroidism 11/29/2014   Acute bronchitis due to infection 10/21/2014   MMT (medial meniscus tear) 09/01/2013    Past Surgical History:  Procedure Laterality Date   CESAREAN SECTION     EYE SURGERY Bilateral 2014   KNEE ARTHROPLASTY Right 06/28/2021   Procedure: COMPUTER ASSISTED TOTAL KNEE ARTHROPLASTY - RNFA;  Surgeon: Dereck Leep, MD;  Location: [facility] ORS;  Service: Orthopedics;  Laterality: Right;   THYROIDECTOMY, PARTIAL      OB History   No obstetric history on file.      Home Medications    Prior to Admission medications   Medication Sig Start Date End Date Taking? Authorizing Provider  acetaminophen (TYLENOL) 650 MG CR tablet Take 650 mg by mouth every 8 (eight) hours as needed for pain.   Yes [provider]  apixaban (ELIQUIS) 5 MG TABS tablet Take 5 mg by mouth 2 (two) times daily.   Yes [provider]  Biotin w/ Vitamins C & E 1250-7.5-7.5 MCG-MG-UNT CHEW Chew 1 Dose by mouth daily.   Yes [provider]  celecoxib (CELEBREX) 200 MG capsule Take 1 capsule (200 mg total) by mouth 2 (two) times daily. 06/29/21  Yes Reche Dixon, PA-C  citalopram (CELEXA) 10 MG tablet Take 10 mg by mouth daily. 10/21/16  Yes [provider]  diclofenac Sodium (VOLTAREN) 1 % GEL Apply 1 application topically daily as needed (pain).   Yes [provider]  Melatonin 2.5 MG CHEW Chew 1 Dose by mouth daily as needed (insomnia).   Yes [provider]  metoprolol succinate (TOPROL-XL) 25 MG 24 hr tablet Take 12.5 mg by mouth at bedtime.   Yes [provider]  simvastatin (ZOCOR) 20 MG tablet Take 20 mg by mouth daily.   Yes [provider]  benzonatate (TESSALON) 200 MG capsule Take 1 capsule (200 mg total) by  mouth 3 (three) times daily as needed for cough. Patient not taking: Reported on 06/10/2021 02/25/21   Coral Spikes, DO  oxyCODONE (OXY IR/ROXICODONE) 5 MG immediate release tablet Take 1 tablet (5 mg total) by mouth every 4 (four) hours as needed for moderate pain (pain score 4-6). 06/29/21   Reche Dixon, PA-C  traMADol (ULTRAM) 50 MG tablet Take 1-2 tablets (50-100 mg total) by mouth every 4 (four) hours as needed for moderate pain. 06/29/21   Reche Dixon, PA-C  mometasone-formoterol (DULERA) 200-5 MCG/ACT AERO Inhale 2 puffs into the lungs 2 (two) times daily. 12/15/16 02/25/21  Theodoro Grist, MD    Family History Family History  Problem Relation Age of Onset   Stroke Mother    Dementia Mother    Lung cancer Father     Social History Social History   Tobacco Use   Smoking status: Never   Smokeless tobacco: Never  Vaping Use   Vaping Use: Never used  Substance Use Topics   Alcohol use: Yes    Alcohol/week: 2.0 standard drinks    Types: 2 Cans of beer per week   Drug use: No     Allergies   Levofloxacin, Penicillins, and Sulfa antibiotics   Review of Systems Review of Systems  Constitutional: Negative.   HENT:  Positive for ear pain and sore throat. Negative for congestion, dental problem, drooling, ear discharge, facial swelling, hearing loss, mouth sores, nosebleeds, postnasal drip, rhinorrhea, sinus pressure, sinus pain, sneezing, tinnitus, trouble swallowing and voice change.   Respiratory:  Positive for cough. Negative for apnea, choking, chest tightness, shortness of breath, wheezing and stridor.   Cardiovascular: Negative.   Musculoskeletal:  Positive for myalgias. Negative for arthralgias, back pain, gait problem, joint swelling, neck pain and neck stiffness.  Skin: Negative.   Neurological:  Positive for headaches. Negative for dizziness, tremors, seizures, syncope, facial asymmetry, speech difficulty, weakness, light-headedness and numbness.    Physical  Exam Triage Vital Signs ED Triage Vitals  Enc Vitals Group     BP 10/21/21 0823 129/81     Pulse Rate 10/21/21 0823 84     Resp 10/21/21 0823 16     Temp 10/21/21 0823 98.9 F (37.2 C)     Temp Source 10/21/21 0823 Oral     SpO2 10/21/21 0823 96 %     Weight 10/21/21 0822 170 lb (77.1 kg)     Height 10/21/21 0822 5\' 4"  (1.626 m)     Head Circumference --      Peak Flow --      Pain Score 10/21/21 0820 5     Pain Loc --      Pain Edu? --      Excl. in [place]? --    No data found.  Updated Vital Signs BP 129/81 (BP Location: Right Arm)    Pulse 84    Temp 98.9 F (37.2 C) (Oral)  Resp 16    Ht 5\' 4"  (1.626 m)    Wt 170 lb (77.1 kg)    SpO2 96%    BMI 29.18 kg/m   Visual Acuity Right Eye Distance:   Left Eye Distance:   Bilateral Distance:    Right Eye Near:   Left Eye Near:    Bilateral Near:     Physical Exam Constitutional:      Appearance: She is well-developed.  HENT:     Head: Normocephalic.     Right Ear: Tympanic membrane and ear canal normal.     Left Ear: Tympanic membrane and ear canal normal.     Nose: Nose normal.     Mouth/Throat:     Mouth: Mucous membranes are moist.     Pharynx: Oropharynx is clear.     Tonsils: No tonsillar exudate.     Comments: Olimpia Tinch thick coating to tongue  Eyes:     Extraocular Movements: Extraocular movements intact.  Cardiovascular:     Rate and Rhythm: Normal rate and regular rhythm.     Pulses: Normal pulses.     Heart sounds: Normal heart sounds.  Pulmonary:     Effort: Pulmonary effort is normal.     Breath sounds: Normal breath sounds.  Musculoskeletal:     Cervical back: Normal range of motion and neck supple.  Skin:    General: Skin is warm and dry.  Neurological:     General: No focal deficit present.     Mental Status: She is alert and oriented to person, place, and time.  Psychiatric:        Mood and Affect: Mood normal.        Behavior: Behavior normal.     UC Treatments / Results  Labs (all labs  ordered are listed, but only abnormal results are displayed) Labs Reviewed  GROUP A STREP BY PCR    EKG   Radiology No results found.  Procedures Procedures (including critical care time)  Medications Ordered in UC Medications - No data to display  Initial Impression / Assessment and Plan / UC Course  I have reviewed the triage vital signs and the nursing notes.  Pertinent labs & imaging results that were available during my care of the patient were reviewed by me and considered in my medical decision making (see chart for details).  Oral illness Sore throat Thrush  Strep PCR negative, etiology of symptoms most likely viral and worsened by congestion and coughing, prescribed lidocaine viscous 2% 15 mils every 4 hours, recommended continue use of Tessalon, may begin use of over-the-counter Mucinex to help further thin secretions, may attempt use of salt water gargles, throat lozenges and warm liquids in addition, will prescribe nystatin for thrush of tongue, most likely secondary infection, given 7-day course, may follow-up with urgent care as needed Final Clinical Impressions(s) / UC Diagnoses   Final diagnoses:  None   Discharge Instructions   None    ED Prescriptions   None    PDMP not reviewed this encounter.   Hans Eden, [place] 10/21/21 [phone]

## 2021-10-21 NOTE — ED Triage Notes (Signed)
Pt here with C/O scratchy throat for 6 days, woke up this morning and tongue is white.

## 2021-10-21 NOTE — Discharge Instructions (Signed)
Your symptoms today are most likely being caused by a virus and should steadily improve in time it can take up to 7 to 10 days before you truly start to see a turnaround however things will get better  Strep PCR test was negative  You can take Tylenol 1000 milligrams every 6 hours for pain   For cough: honey 1/2 to 1 teaspoon (you can dilute the honey in water or another fluid).  You may continue use of Tessalon pill. You can use a humidifier for chest congestion and cough.  If you don't have a humidifier, you can sit in the bathroom with the hot shower running.      For sore throat: try warm salt water gargles, cepacol lozenges, throat spray, warm tea or water with lemon/honey, popsicles or ice, or OTC cold relief medicine for throat discomfort.  May gargle and spit lidocaine solution every 4 hours for temporary relief  For your tongue: gargle and spit four times a day for 7 days, continue good dental hygiene    For congestion: You begin use of Mucinex which will help thin out secretions . take a daily anti-histamine like Zyrtec, Claritin, and a oral decongestant, such as pseudoephedrine.  You can also use Flonase 1-2 sprays in each nostril daily.   It is important to stay hydrated: drink plenty of fluids (water, gatorade/powerade/pedialyte, juices, or teas) to keep your throat moisturized and help further relieve irritation/discomfort.

## 2022-05-08 ENCOUNTER — Ambulatory Visit: Payer: Medicare PPO | Admitting: Dermatology

## 2022-05-08 DIAGNOSIS — L821 Other seborrheic keratosis: Secondary | ICD-10-CM

## 2022-05-08 DIAGNOSIS — Z1283 Encounter for screening for malignant neoplasm of skin: Secondary | ICD-10-CM | POA: Diagnosis not present

## 2022-05-08 DIAGNOSIS — D229 Melanocytic nevi, unspecified: Secondary | ICD-10-CM

## 2022-05-08 DIAGNOSIS — Z86018 Personal history of other benign neoplasm: Secondary | ICD-10-CM | POA: Diagnosis not present

## 2022-05-08 DIAGNOSIS — L918 Other hypertrophic disorders of the skin: Secondary | ICD-10-CM | POA: Diagnosis not present

## 2022-05-08 DIAGNOSIS — L578 Other skin changes due to chronic exposure to nonionizing radiation: Secondary | ICD-10-CM | POA: Diagnosis not present

## 2022-05-08 DIAGNOSIS — L814 Other melanin hyperpigmentation: Secondary | ICD-10-CM | POA: Diagnosis not present

## 2022-05-08 DIAGNOSIS — L820 Inflamed seborrheic keratosis: Secondary | ICD-10-CM | POA: Diagnosis not present

## 2022-05-08 DIAGNOSIS — D1800 Hemangioma unspecified site: Secondary | ICD-10-CM

## 2022-05-08 NOTE — Progress Notes (Signed)
Follow-Up Visit   Subjective  Kayla Zamora is a 76 y.o. female who presents for the following: Annual Exam (History of dysplastic nevi - The patient presents for Total-Body Skin Exam (TBSE) for skin cancer screening and mole check.  The patient has spots, moles and lesions to be evaluated, some may be new or changing and the patient has concerns that these could be cancer./).  The following portions of the chart were reviewed this encounter and updated as appropriate:   Tobacco  Allergies  Meds  Problems  Med Hx  Surg Hx  Fam Hx     Review of Systems:  No other skin or systemic complaints except as noted in HPI or Assessment and Plan.  Objective  Well appearing patient in no apparent distress; mood and affect are within normal limits.  A full examination was performed including scalp, head, eyes, ears, nose, lips, neck, chest, axillae, abdomen, back, buttocks, bilateral upper extremities, bilateral lower extremities, hands, feet, fingers, toes, fingernails, and toenails. All findings within normal limits unless otherwise noted below.  Chest, back, right medial ankle (20) Erythematous stuck-on, waxy papule or plaque  Right neck Fleshy, skin-colored pedunculated papules.     Assessment & Plan   History of Dysplastic Nevi - No evidence of recurrence today - Recommend regular full body skin exams - Recommend daily broad spectrum sunscreen SPF 30+ to sun-exposed areas, reapply every 2 hours as needed.  - Call if any new or changing lesions are noted between office visits  Lentigines - Scattered tan macules - Due to sun exposure - Benign-appearing, observe - Recommend daily broad spectrum sunscreen SPF 30+ to sun-exposed areas, reapply every 2 hours as needed. - Call for any changes  Seborrheic Keratoses - Stuck-on, waxy, tan-brown papules and/or plaques  - Benign-appearing - Discussed benign etiology and prognosis. - Observe - Call for any changes  Melanocytic  Nevi - Tan-brown and/or pink-flesh-colored symmetric macules and papules - Benign appearing on exam today - Observation - Call clinic for new or changing moles - Recommend daily use of broad spectrum spf 30+ sunscreen to sun-exposed areas.   Hemangiomas - Red papules - Discussed benign nature - Observe - Call for any changes  Actinic Damage - Chronic condition, secondary to cumulative UV/sun exposure - diffuse scaly erythematous macules with underlying dyspigmentation - Recommend daily broad spectrum sunscreen SPF 30+ to sun-exposed areas, reapply every 2 hours as needed.  - Staying in the shade or wearing long sleeves, sun glasses (UVA+UVB protection) and wide brim hats (4-inch brim around the entire circumference of the hat) are also recommended for sun protection.  - Call for new or changing lesions.  Skin cancer screening performed today.  Inflamed seborrheic keratosis (20) Chest, back, right medial ankle  Destruction of lesion - Chest, back, right medial ankle Complexity: simple   Destruction method: cryotherapy   Informed consent: discussed and consent obtained   Timeout:  patient name, date of birth, surgical site, and procedure verified Lesion destroyed using liquid nitrogen: Yes   Region frozen until ice ball extended beyond lesion: Yes   Outcome: patient tolerated procedure well with no complications   Post-procedure details: wound care instructions given    Skin tag Right neck  Destruction of lesion - Right neck Complexity: simple   Destruction method: cryotherapy   Informed consent: discussed and consent obtained   Timeout:  patient name, date of birth, surgical site, and procedure verified Lesion destroyed using liquid nitrogen: Yes   Region frozen until  ice ball extended beyond lesion: Yes   Outcome: patient tolerated procedure well with no complications   Post-procedure details: wound care instructions given     Return in about 1 year (around 05/09/2023)  for TBSE.  I, Ashok Cordia, CMA, am acting as scribe for Sarina Ser, MD . Documentation: I have reviewed the above documentation for accuracy and completeness, and I agree with the above.  Sarina Ser, MD

## 2022-05-11 ENCOUNTER — Encounter: Payer: Self-pay | Admitting: Dermatology

## 2022-11-10 ENCOUNTER — Encounter: Payer: Self-pay | Admitting: Emergency Medicine

## 2022-11-10 ENCOUNTER — Ambulatory Visit
Admission: EM | Admit: 2022-11-10 | Discharge: 2022-11-10 | Disposition: A | Discharge status: Home / Self Care | Payer: Medicare PPO | Attending: Emergency Medicine | Admitting: Emergency Medicine

## 2022-11-10 DIAGNOSIS — U071 COVID-19: Secondary | ICD-10-CM | POA: Diagnosis present

## 2022-11-10 LAB — SARS CORONAVIRUS 2 BY RT PCR: SARS Coronavirus 2 by RT PCR: POSITIVE — AB

## 2022-11-10 MED ORDER — BENZONATATE 100 MG PO CAPS: 100.0000 mg | ORAL_CAPSULE | Freq: Three times a day (TID) | ORAL | 0 refills | Status: AC

## 2022-11-10 MED ORDER — PROMETHAZINE-DM 6.25-15 MG/5ML PO SYRP: 2.5000 mL | ORAL_SOLUTION | Freq: Every evening | ORAL | 0 refills | Status: AC | PRN

## 2022-11-10 MED ORDER — MOLNUPIRAVIR EUA 200MG CAPSULE: 4.0000 | ORAL_CAPSULE | Freq: Two times a day (BID) | ORAL | 0 refills | Status: AC

## 2022-11-10 NOTE — ED Provider Notes (Signed)
[facility]    CSN: [id number] Arrival date & time: 11/10/22  1030      History   Chief Complaint Chief Complaint  Patient presents with   Nasal Congestion   Cough    HPI Kayla Zamora is a 77 y.o. female.   Patient presents for evaluation of fever, nasal congestion, rhinorrhea, sinus pain and pressure, sore throat, hoarseness, cough, headaches and diarrhea present for 3 days.  Cough is nonproductive.  Endorses bilateral ear fullness but denies pain, drainage or pruritus.  Last occurrence of diarrhea 3 days ago described as green and watery.  Decreased appetite but tolerating fluids.  Has attempted use of Imodium and Benadryl which have been minimally helpful.  Denies shortness of breath or wheezing.  Endorses 4 to 5 days ago had an episode of paroxysmal A-fib, currently endorses sinus rhythm, taking Eliquis daily.    Past Medical History:  Diagnosis Date   Arthritis    Atrial fibrillation (HCC)    Cataracts, bilateral    Chronic anticoagulation    Apixaban   COPD (chronic obstructive pulmonary disease) (HCC)    Depression    Dyshidrotic eczema    Dysplastic nevus 12/21/2019   a.) L lat waistline - moderate. 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   Epiphora due to insufficient drainage of both sides 11/08/2020   Toxic conjunctivitis, bilateral 03/06/2020   Arthritis of right knee 08/30/2018   Multiple thyroid nodules 08/30/2018   Urge urinary incontinence 08/30/2018   Bereavement due to  life event 12/17/2016   COPD exacerbation ([place]) 12/15/2016   Elevated troponin 12/15/2016   SVT (supraventricular tachycardia) 12/15/2016   Pneumonia 12/14/2016   Acute respiratory insufficiency 12/13/2016   Left lower lobe pneumonia 12/13/2016   Chest pain 12/13/2016   Persistent cough 12/13/2016   Hypothyroidism 11/29/2014   Acute bronchitis due to infection 10/21/2014   MMT (medial meniscus tear) 09/01/2013    Past Surgical History:  Procedure Laterality Date   CESAREAN SECTION     EYE SURGERY Bilateral 2014   KNEE ARTHROPLASTY Right 06/28/2021   Procedure: COMPUTER ASSISTED TOTAL KNEE ARTHROPLASTY - RNFA;  Surgeon: Dereck Leep, MD;  Location: [facility] OR[facility];  Service: Orthopedics;  Laterality: Right;   THYROIDECTOMY, PARTIAL      OB History   No obstetric history on file.      Home Medications    Prior to Admission medications   Medication Sig Start Date End Date Taking? Authorizing Provider  apixaban (ELIQUIS) 5 MG TABS tablet Take 5 mg by mouth 2 (two) times daily.   Yes [provider]  celecoxib (CELEBREX) 200 MG capsule Take 1 capsule (200 mg total) by mouth 2 (two) times daily. 06/29/21  Yes Reche Dixon, PA-C  citalopram (CELEXA) 10 MG tablet Take 10 mg by mouth daily. 10/21/16  Yes [provider]  methimazole (TAPAZOLE) 5 MG tablet Take 2.5 mg by mouth daily.   Yes [provider]  metoprolol succinate (TOPROL-XL) 25 MG 24 hr tablet Take 50  mg by mouth at bedtime.   Yes [provider]  simvastatin (ZOCOR) 20 MG tablet Take 20 mg by mouth daily.   Yes [provider]  acetaminophen (TYLENOL) 650 MG CR tablet Take 650 mg by mouth every 8 (eight) hours as needed for pain.    [provider]  Biotin w/ Vitamins C & E 1250-7.5-7.5 MCG-MG-UNT CHEW Chew 1 Dose by mouth daily.    [provider]  diclofenac Sodium (VOLTAREN) 1 % GEL Apply 1 application topically daily as needed (pain).    [provider]   lidocaine (XYLOCAINE) 2 % solution Use as directed 15 mLs in the mouth or throat as needed for mouth pain. 10/21/21   Hans Eden, NP  Melatonin 2.5 MG CHEW Chew 1 Dose by mouth daily as needed (insomnia).    [provider]  nystatin (MYCOSTATIN) 100000 UNIT/ML suspension Take 5 mLs (500,000 Units total) by mouth 4 (four) times daily. 10/21/21   Lawerence Dery, Leitha Schuller, NP  mometasone-formoterol (DULERA) 200-5 MCG/ACT AERO Inhale 2 puffs into the lungs 2 (two) times daily. 12/15/16 02/25/21  Theodoro Grist, MD    Family History Family History  Problem Relation Age of Onset   Stroke Mother    Dementia Mother    Lung cancer Father     Social History Social History   Tobacco Use   Smoking status: Never   Smokeless tobacco: Never  Vaping Use   Vaping Use: Never used  Substance Use Topics   Alcohol use: Yes    Alcohol/week: 2.0 standard drinks of alcohol    Types: 2 Cans of beer per week   Drug use: No     Allergies   Levofloxacin, Penicillins, and Sulfa antibiotics   Review of Systems Review of Systems  Constitutional:  Positive for fever. Negative for activity change, appetite change, chills, diaphoresis, fatigue and unexpected weight change.  HENT:  Positive for congestion, rhinorrhea, sinus pressure, sinus pain, sore throat and voice change. Negative for dental problem, drooling, ear discharge, ear pain, facial swelling, hearing loss, mouth sores, nosebleeds, postnasal drip, sneezing, tinnitus and trouble swallowing.   Respiratory:  Positive for cough. Negative for apnea, choking, chest tightness, shortness of breath, wheezing and stridor.   Cardiovascular: Negative.   Gastrointestinal:  Positive for diarrhea. Negative for abdominal distention, abdominal pain, anal bleeding, blood in stool, constipation, nausea, rectal pain and vomiting.  Skin: Negative.   Neurological:  Positive for headaches. Negative for dizziness, tremors, seizures, syncope, facial asymmetry, speech  difficulty, weakness, light-headedness and numbness.     Physical Exam Triage Vital Signs ED Triage Vitals  Enc Vitals Group     BP 11/10/22 1156 120/76     Pulse Rate 11/10/22 1156 79     Resp 11/10/22 1156 18     Temp 11/10/22 1156 99.3 F (37.4 C)     Temp Source 11/10/22 1156 Oral     SpO2 11/10/22 1156 96 %     Weight 11/10/22 1152 169 lb 15.6 oz (77.1 kg)     Height 11/10/22 1152 '5\' 4"'$  (1.626 m)     Head Circumference --      Peak Flow --      Pain Score 11/10/22 1152 4     Pain Loc --      Pain Edu? --      Excl. in [place]? --    No data found.  Updated Vital Signs BP 120/76 (BP Location: Left Arm)   Pulse 79  Temp 99.3 F (37.4 C) (Oral)   Resp 18   Ht '5\' 4"'$  (1.626 m)   Wt 169 lb 15.6 oz (77.1 kg)   SpO2 96%   BMI 29.18 kg/m   Visual Acuity Right Eye Distance:   Left Eye Distance:   Bilateral Distance:    Right Eye Near:   Left Eye Near:    Bilateral Near:     Physical Exam Constitutional:      Appearance: Normal appearance.  HENT:     Head: Normocephalic.     Right Ear: Tympanic membrane, ear canal and external ear normal.     Left Ear: Tympanic membrane, ear canal and external ear normal.     Nose: Congestion and rhinorrhea present.     Mouth/Throat:     Mouth: Mucous membranes are moist.     Pharynx: Posterior oropharyngeal erythema present.  Eyes:     Extraocular Movements: Extraocular movements intact.  Cardiovascular:     Rate and Rhythm: Normal rate and regular rhythm.     Pulses: Normal pulses.     Heart sounds: Normal heart sounds.  Pulmonary:     Effort: Pulmonary effort is normal.     Breath sounds: Normal breath sounds.  Skin:    General: Skin is warm and dry.  Neurological:     Mental Status: She is alert and oriented to person, place, and time. Mental status is at baseline.     Cranial Nerves: No cranial nerve deficit.  Psychiatric:        Mood and Affect: Mood normal.        Behavior: Behavior normal.      UC  Treatments / Results  Labs (all labs ordered are listed, but only abnormal results are displayed) Labs Reviewed  SARS CORONAVIRUS 2 BY RT PCR - Abnormal; Notable for the following components:      Result Value   SARS Coronavirus 2 by RT PCR POSITIVE (*)    All other components within normal limits    EKG   Radiology No results found.  Procedures Procedures (including critical care time)  Medications Ordered in UC Medications - No data to display  Initial Impression / Assessment and Plan / UC Course  I have reviewed the triage vital signs and the nursing notes.  Pertinent labs & imaging results that were available during my care of the patient were reviewed by me and considered in my medical decision making (see chart for details).  COVID-19  Patient is in no signs of distress nor toxic appearing.  Vital signs are stable.  Low suspicion for pneumonia, pneumothorax or bronchitis and therefore will defer imaging.  COVID test positive, antiviral prescribed, avoided use of Paxlovid due to contradictions with Eliquis, discussed this with patient, additionally prescribed Tessalon for coughing and discussed quarantine guidelines,May use additional over-the-counter medications as needed for supportive care.  May follow-up with urgent care as needed if symptoms persist or worsen.    Final Clinical Impressions(s) / UC Diagnoses   Final diagnoses:  [id number]     Discharge Instructions      COVID-19 positive, COVID is a virus and should steadily improve with time  Per CDC recommendation she will need to quarantine for an additional 2 days, may continue activity on Wednesday, if still having symptoms at that time please wear mask  You may begin use of Paxlovid taking every morning and every evening for 5 days, this minimizes the amount of virus in your body which reduces your  symptoms and timeline that you are sick, does not fully take away symptoms  Paxlovid interferes with your  cholesterol medicine, please stop simvastatin for 10 days, may resume on February 8   You can take Tylenol and/or Ibuprofen as needed for fever reduction and pain relief.   For cough: honey 1/2 to 1 teaspoon (you can dilute the honey in water or another fluid).  You can also use guaifenesin for cough. You can use a humidifier for chest congestion and cough.  If you don't have a humidifier, you can sit in the bathroom with the hot shower running.      For sore throat: try warm salt water gargles, cepacol lozenges, throat spray, warm tea or water with lemon/honey, popsicles or ice, or OTC cold relief medicine for throat discomfort.   For congestion: take a daily anti-histamine like Zyrtec, Claritin.  It is important to stay hydrated: drink plenty of fluids (water, gatorade/powerade/pedialyte, juices, or teas) to keep your throat moisturized and help further relieve irritation/discomfort.     ED Prescriptions   None    PDMP not reviewed this encounter.   Hans Eden, NP 11/10/22 1254

## 2022-11-10 NOTE — Discharge Instructions (Addendum)
COVID-19 positive, COVID is a virus and should steadily improve with time  Per CDC recommendation she will need to quarantine for an additional 2 days, may continue activity on Wednesday, if still having symptoms at that time please wear mask  You may begin use of antiviral taking every morning and every evening for 5 days, this minimizes the amount of virus in your body which reduces your symptoms and timeline that you are sick, does not fully take away symptoms   You can take Tylenol and/or Ibuprofen as needed for fever reduction and pain relief.   For cough: honey 1/2 to 1 teaspoon (you can dilute the honey in water or another fluid).  You can also use guaifenesin for cough. You can use a humidifier for chest congestion and cough.  If you don't have a humidifier, you can sit in the bathroom with the hot shower running.      For sore throat: try warm salt water gargles, cepacol lozenges, throat spray, warm tea or water with lemon/honey, popsicles or ice, or OTC cold relief medicine for throat discomfort.   For congestion: take a daily anti-histamine like Zyrtec, Claritin.  It is important to stay hydrated: drink plenty of fluids (water, gatorade/powerade/pedialyte, juices, or teas) to keep your throat moisturized and help further relieve irritation/discomfort.

## 2022-11-10 NOTE — ED Triage Notes (Signed)
Pt c/o nasal congestion, headache, cough, fever (100.8), hoarseness. Started about 3 days ago. Pt states she has h/o pneumonia.

## 2023-04-29 ENCOUNTER — Ambulatory Visit: Payer: Medicare PPO | Admitting: Dermatology

## 2023-05-18 ENCOUNTER — Ambulatory Visit: Payer: Medicare PPO | Admitting: Dermatology

## 2023-06-03 ENCOUNTER — Ambulatory Visit: Payer: Medicare PPO | Admitting: Dermatology

## 2023-06-03 ENCOUNTER — Encounter: Payer: Self-pay | Admitting: Dermatology

## 2023-06-03 DIAGNOSIS — L578 Other skin changes due to chronic exposure to nonionizing radiation: Secondary | ICD-10-CM | POA: Diagnosis not present

## 2023-06-03 DIAGNOSIS — L814 Other melanin hyperpigmentation: Secondary | ICD-10-CM

## 2023-06-03 DIAGNOSIS — Z1283 Encounter for screening for malignant neoplasm of skin: Secondary | ICD-10-CM | POA: Diagnosis not present

## 2023-06-03 DIAGNOSIS — L821 Other seborrheic keratosis: Secondary | ICD-10-CM

## 2023-06-03 DIAGNOSIS — Z86018 Personal history of other benign neoplasm: Secondary | ICD-10-CM

## 2023-06-03 DIAGNOSIS — L820 Inflamed seborrheic keratosis: Secondary | ICD-10-CM | POA: Diagnosis not present

## 2023-06-03 DIAGNOSIS — L918 Other hypertrophic disorders of the skin: Secondary | ICD-10-CM | POA: Diagnosis not present

## 2023-06-03 NOTE — Patient Instructions (Signed)

## 2023-06-03 NOTE — Progress Notes (Signed)
Follow-Up Visit   Subjective  Kayla Zamora is a 77 y.o. female who presents for the following: Skin Cancer Screening and Full Body Skin Exam  The patient presents for Total-Body Skin Exam (TBSE) for skin cancer screening and mole check. The patient has spots, moles and lesions to be evaluated, some may be new or changing and the patient may have concern these could be cancer.  Patient with hx of dysplastic nevus. Some areas at chest that are irritated and she picks at.   The following portions of the chart were reviewed this encounter and updated as appropriate: medications, allergies, medical history  Review of Systems:  No other skin or systemic complaints except as noted in HPI or Assessment and Plan.  Objective  Well appearing patient in no apparent distress; mood and affect are within normal limits.  A full examination was performed including scalp, head, eyes, ears, nose, lips, neck, chest, axillae, abdomen, back, buttocks, bilateral upper extremities, bilateral lower extremities, hands, feet, fingers, toes, fingernails, and toenails. All findings within normal limits unless otherwise noted below.   Relevant physical exam findings are noted in the Assessment and Plan.  L clavicle x 1, R waist x 1, R forearm x 1 (3) Erythematous stuck-on, waxy papule or plaque    Assessment & Plan   SKIN CANCER SCREENING PERFORMED TODAY.  ACTINIC DAMAGE - Chronic condition, secondary to cumulative UV/sun exposure - diffuse scaly erythematous macules with underlying dyspigmentation - Recommend daily broad spectrum sunscreen SPF 30+ to sun-exposed areas, reapply every 2 hours as needed.  - Staying in the shade or wearing long sleeves, sun glasses (UVA+UVB protection) and wide brim hats (4-inch brim around the entire circumference of the hat) are also recommended for sun protection.  - Call for new or changing lesions.  LENTIGINES, SEBORRHEIC KERATOSES, HEMANGIOMAS - Benign normal skin  lesions - Benign-appearing - Call for any changes  MELANOCYTIC NEVI - Tan-brown and/or pink-flesh-colored symmetric macules and papules - Benign appearing on exam today - Observation - Call clinic for new or changing moles - Recommend daily use of broad spectrum spf 30+ sunscreen to sun-exposed areas.   History of Dysplastic Nevi - No evidence of recurrence today - Recommend regular full body skin exams - Recommend daily broad spectrum sunscreen SPF 30+ to sun-exposed areas, reapply every 2 hours as needed.  - Call if any new or changing lesions are noted between office visits  Acrochordons (Skin Tags) - Fleshy, skin-colored pedunculated papules - Benign appearing.  - Observe. - If desired, they can be removed with an in office procedure that is not covered by insurance. - Please call the clinic if you notice any new or changing lesions.    Inflamed seborrheic keratosis (3) L clavicle x 1, R waist x 1, R forearm x 1  Symptomatic, irritating, patient would like treated.  Benign-appearing.  Call clinic for new or changing lesions.    Destruction of lesion - L clavicle x 1, R waist x 1, R forearm x 1 (3)  Destruction method: cryotherapy   Informed consent: discussed and consent obtained   Lesion destroyed using liquid nitrogen: Yes   Cryotherapy cycles:  2 Outcome: patient tolerated procedure well with no complications   Post-procedure details: wound care instructions given     Return in about 1 year (around 06/02/2024) for Hx Dysplastic Nevi, TBSE.  Anise Salvo, RMA, am acting as scribe for Armida Sans, MD .   Documentation: I have reviewed the above documentation for accuracy  and completeness, and I agree with the above.  Armida Sans, MD

## 2023-09-09 IMAGING — DX DG KNEE 1-2V PORT*R*
2 series · 2 of 2 positions shown · non-contrast
Comparison: None.

CLINICAL DATA: Status post total knee replacement

EXAM:
PORTABLE RIGHT KNEE - 1-2 VIEW

[knee ap]
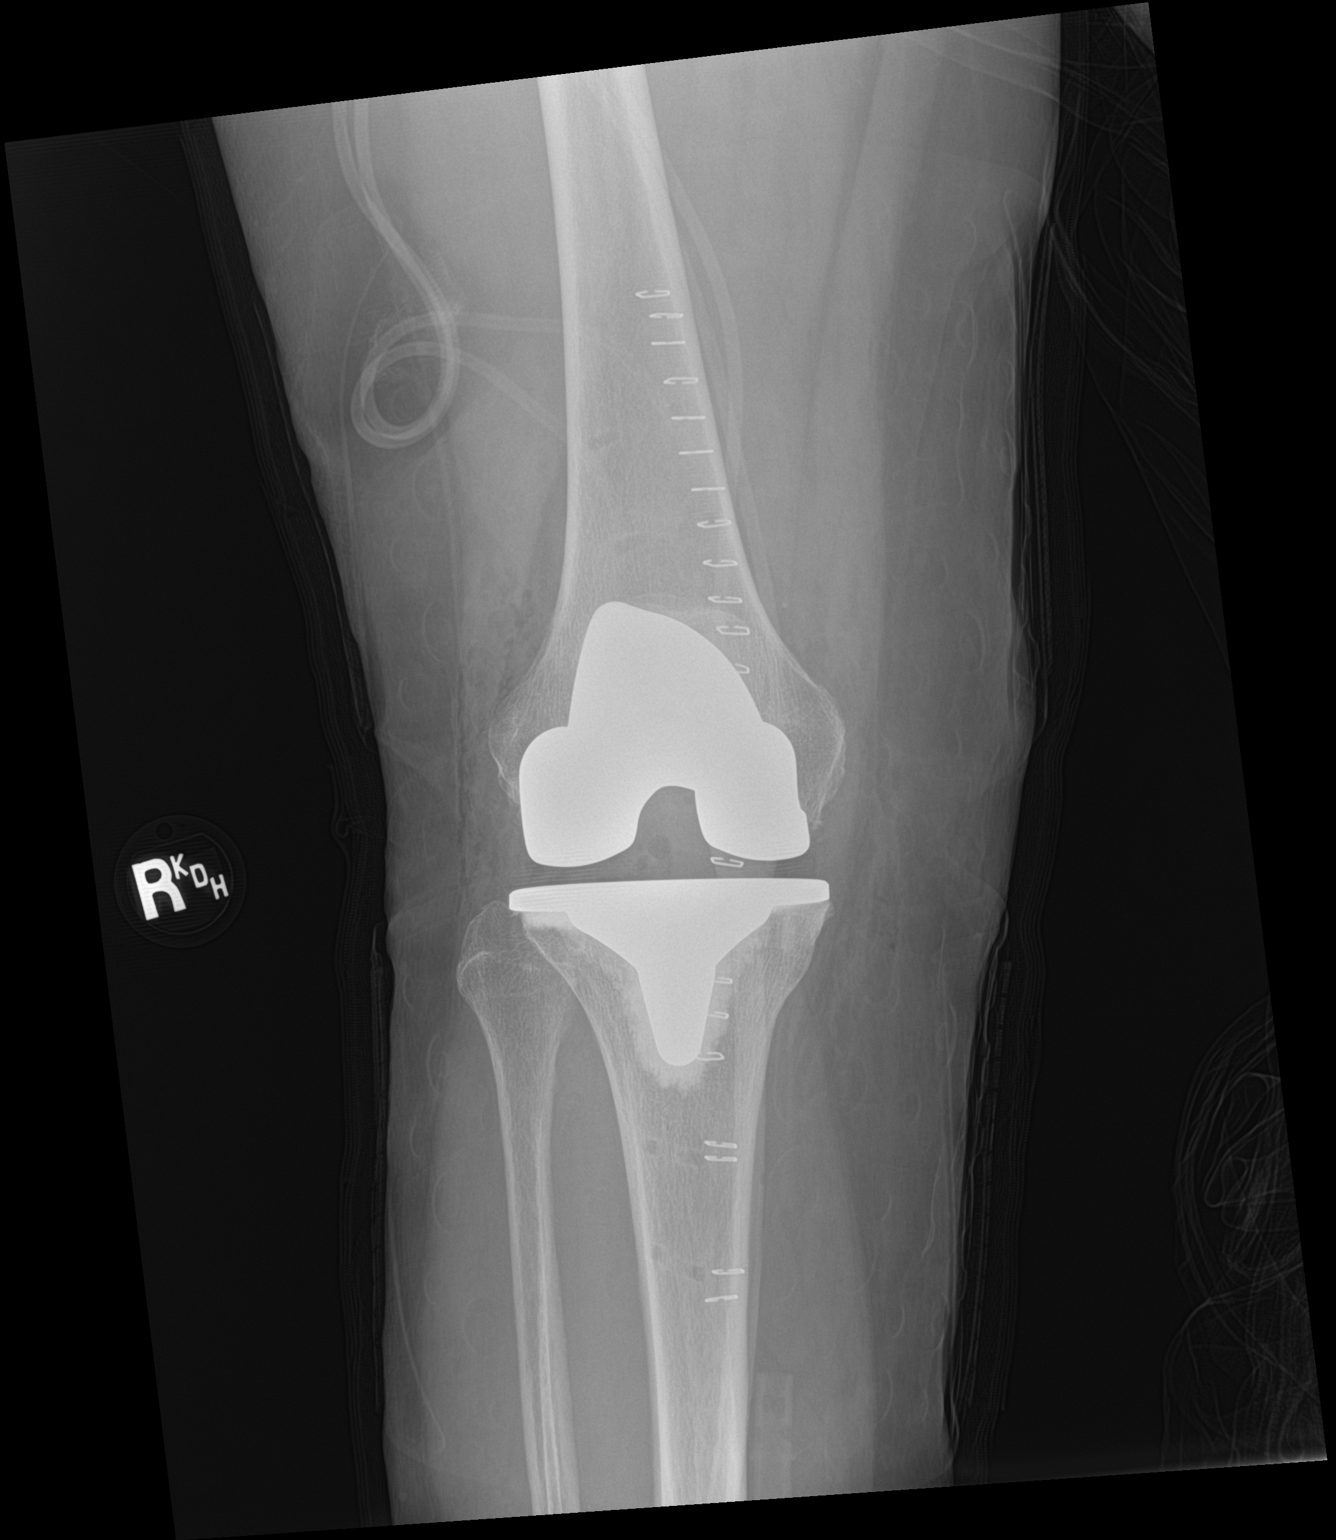

[knee lat]
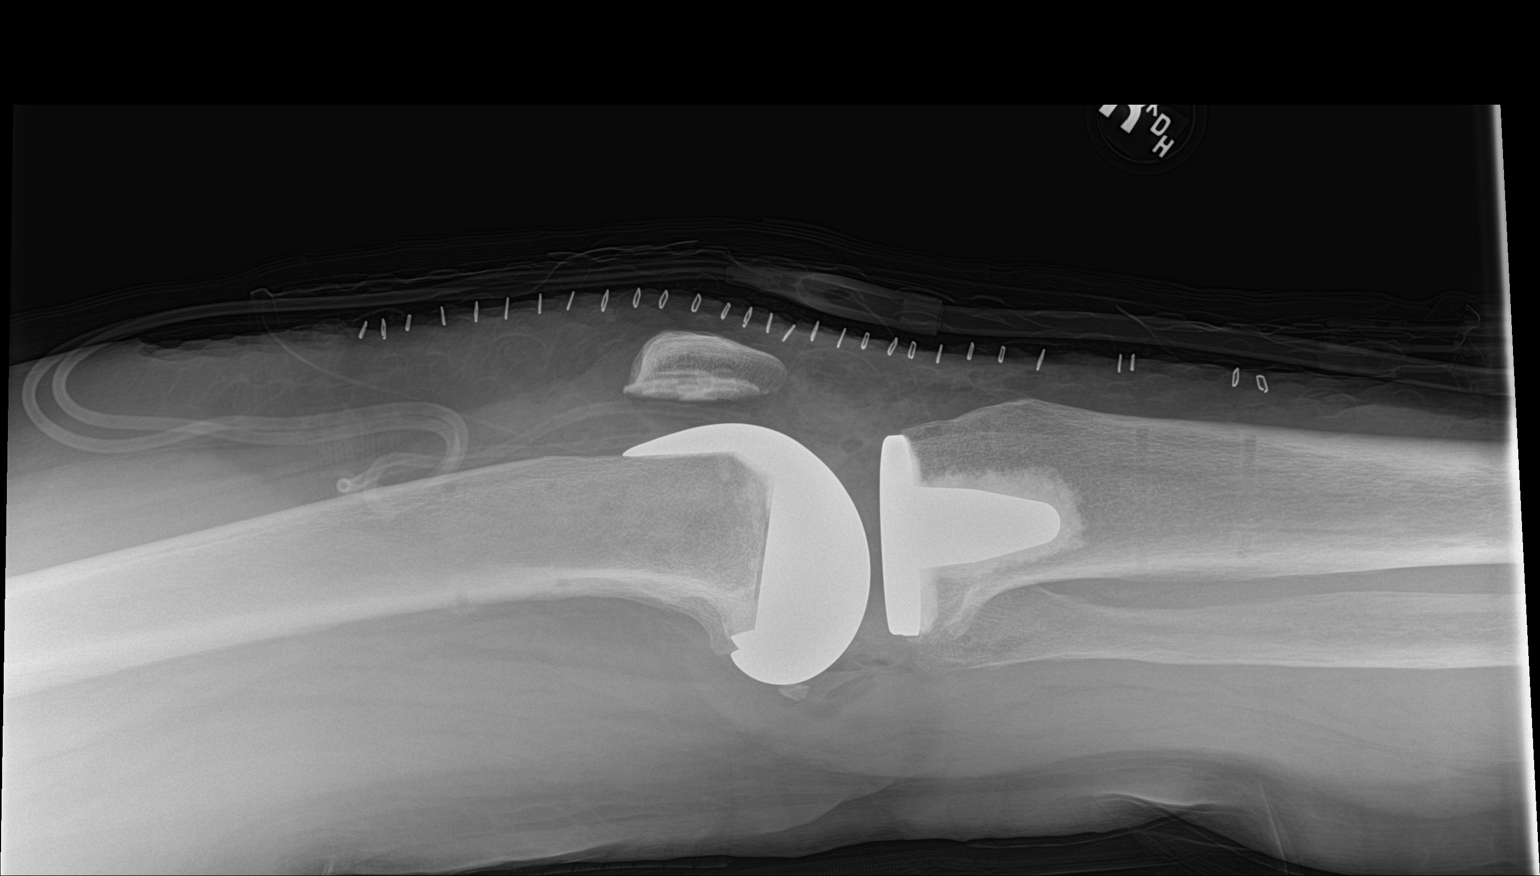

[2 of 2 positions shown; findings below may reference images not displayed]

FINDINGS: Changes of right knee replacement. No hardware complicating feature.
No acute bony abnormality. Soft tissue drains in place.
IMPRESSION: Right knee replacement without visible complicating feature.

## 2024-06-16 ENCOUNTER — Ambulatory Visit: Payer: Medicare PPO | Admitting: Dermatology

## 2024-07-11 ENCOUNTER — Ambulatory Visit: Admitting: Dermatology

## 2024-07-11 DIAGNOSIS — D1801 Hemangioma of skin and subcutaneous tissue: Secondary | ICD-10-CM | POA: Diagnosis not present

## 2024-07-11 DIAGNOSIS — D229 Melanocytic nevi, unspecified: Secondary | ICD-10-CM

## 2024-07-11 DIAGNOSIS — W908XXA Exposure to other nonionizing radiation, initial encounter: Secondary | ICD-10-CM

## 2024-07-11 DIAGNOSIS — Z1283 Encounter for screening for malignant neoplasm of skin: Secondary | ICD-10-CM

## 2024-07-11 DIAGNOSIS — L821 Other seborrheic keratosis: Secondary | ICD-10-CM

## 2024-07-11 DIAGNOSIS — L578 Other skin changes due to chronic exposure to nonionizing radiation: Secondary | ICD-10-CM

## 2024-07-11 DIAGNOSIS — L814 Other melanin hyperpigmentation: Secondary | ICD-10-CM

## 2024-07-11 DIAGNOSIS — Z86018 Personal history of other benign neoplasm: Secondary | ICD-10-CM

## 2024-07-11 DIAGNOSIS — L820 Inflamed seborrheic keratosis: Secondary | ICD-10-CM

## 2024-07-11 NOTE — Progress Notes (Unsigned)
   Follow-Up Visit   Subjective  Kayla Zamora is a 78 y.o. female who presents for the following: Skin Cancer Screening and Full Body Skin Exam  The patient presents for Total-Body Skin Exam (TBSE) for skin cancer screening and mole check. The patient has spots, moles and lesions to be evaluated, some may be new or changing and the patient may have concern these could be cancer.  The following portions of the chart were reviewed this encounter and updated as appropriate: medications, allergies, medical history  Review of Systems:  No other skin or systemic complaints except as noted in HPI or Assessment and Plan.  Objective  Well appearing patient in no apparent distress; mood and affect are within normal limits.  A full examination was performed including scalp, head, eyes, ears, nose, lips, neck, chest, axillae, abdomen, back, buttocks, bilateral upper extremities, bilateral lower extremities, hands, feet, fingers, toes, fingernails, and toenails. All findings within normal limits unless otherwise noted below.   Relevant physical exam findings are noted in the Assessment and Plan.  R upper eyelid x 1, neck and shoulders x 22, L lower eyelid x 1, face x 6, chest x 6 (36) Erythematous stuck-on, waxy papule or plaque  Assessment & Plan   SKIN CANCER SCREENING PERFORMED TODAY.  ACTINIC DAMAGE - Chronic condition, secondary to cumulative UV/sun exposure - diffuse scaly erythematous macules with underlying dyspigmentation - Recommend daily broad spectrum sunscreen SPF 30+ to sun-exposed areas, reapply every 2 hours as needed.  - Staying in the shade or wearing long sleeves, sun glasses (UVA+UVB protection) and wide brim hats (4-inch brim around the entire circumference of the hat) are also recommended for sun protection.  - Call for new or changing lesions.  LENTIGINES, SEBORRHEIC KERATOSES, HEMANGIOMAS - Benign normal skin lesions - Benign-appearing - Call for any  changes  MELANOCYTIC NEVI - Tan-brown and/or pink-flesh-colored symmetric macules and papules - Benign appearing on exam today - Observation - Call clinic for new or changing moles - Recommend daily use of broad spectrum spf 30+ sunscreen to sun-exposed areas.   HISTORY OF DYSPLASTIC NEVI - L lat waistline, L ant waistline near the groin No evidence of recurrence today Recommend regular full body skin exams Recommend daily broad spectrum sunscreen SPF 30+ to sun-exposed areas, reapply every 2 hours as needed.  Call if any new or changing lesions are noted between office visits  INFLAMED SEBORRHEIC KERATOSIS (36) R upper eyelid x 1, neck and shoulders x 22, L lower eyelid x 1, face x 6, chest x 6 (36) Symptomatic, irritating, patient would like treated.  Destruction of lesion - R upper eyelid x 1, neck and shoulders x 22, L lower eyelid x 1, face x 6, chest x 6 (36) Complexity: simple   Destruction method: cryotherapy   Informed consent: discussed and consent obtained   Timeout:  patient name, date of birth, surgical site, and procedure verified Lesion destroyed using liquid nitrogen: Yes   Region frozen until ice ball extended beyond lesion: Yes   Outcome: patient tolerated procedure well with no complications   Post-procedure details: wound care instructions given     Return in about 6 months (around 01/08/2025) for ISK follow up.  LILLETTE Rosina Mayans, CMA, am acting as scribe for Alm Rhyme, MD .   Documentation: I have reviewed the above documentation for accuracy and completeness, and I agree with the above.  Alm Rhyme, MD

## 2024-07-11 NOTE — Patient Instructions (Signed)

## 2024-07-13 ENCOUNTER — Encounter: Payer: Self-pay | Admitting: Dermatology

## 2025-01-10 ENCOUNTER — Ambulatory Visit: Admitting: Dermatology
